# Patient Record
Sex: Female | Born: 2000 | Race: Black or African American | Hispanic: No | Marital: Single | Smoking: Never smoker
Health system: Southern US, Community
[De-identification: ages and names within clinical notes are randomized; demographics above are authoritative.]

## PROBLEM LIST (undated history)

## (undated) ENCOUNTER — Inpatient Hospital Stay (HOSPITAL_COMMUNITY): Payer: Self-pay

## (undated) DIAGNOSIS — L309 Dermatitis, unspecified: Secondary | ICD-10-CM

## (undated) DIAGNOSIS — D649 Anemia, unspecified: Secondary | ICD-10-CM

## (undated) DIAGNOSIS — A64 Unspecified sexually transmitted disease: Secondary | ICD-10-CM

## (undated) DIAGNOSIS — D219 Benign neoplasm of connective and other soft tissue, unspecified: Secondary | ICD-10-CM

## (undated) DIAGNOSIS — T783XXA Angioneurotic edema, initial encounter: Secondary | ICD-10-CM

## (undated) DIAGNOSIS — L509 Urticaria, unspecified: Secondary | ICD-10-CM

## (undated) HISTORY — DX: Anemia, unspecified: D64.9

## (undated) HISTORY — DX: Dermatitis, unspecified: L30.9

## (undated) HISTORY — PX: EYE SURGERY: SHX253

## (undated) HISTORY — DX: Angioneurotic edema, initial encounter: T78.3XXA

## (undated) HISTORY — DX: Urticaria, unspecified: L50.9

## (undated) HISTORY — DX: Unspecified sexually transmitted disease: A64

---
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
## 2018-10-20 ENCOUNTER — Ambulatory Visit (INDEPENDENT_AMBULATORY_CARE_PROVIDER_SITE_OTHER): Payer: 59 | Admitting: Certified Nurse Midwife

## 2018-10-20 ENCOUNTER — Other Ambulatory Visit: Payer: Self-pay

## 2018-10-20 ENCOUNTER — Encounter: Payer: Self-pay | Admitting: Certified Nurse Midwife

## 2018-10-20 VITALS — BP 110/64 | HR 68 | Temp 97.2°F | Resp 16 | Ht 62.75 in | Wt 142.0 lb

## 2018-10-20 DIAGNOSIS — Z8619 Personal history of other infectious and parasitic diseases: Secondary | ICD-10-CM | POA: Diagnosis not present

## 2018-10-20 DIAGNOSIS — Z113 Encounter for screening for infections with a predominantly sexual mode of transmission: Secondary | ICD-10-CM | POA: Diagnosis not present

## 2018-10-20 DIAGNOSIS — Z01419 Encounter for gynecological examination (general) (routine) without abnormal findings: Secondary | ICD-10-CM | POA: Diagnosis not present

## 2018-10-20 NOTE — Patient Instructions (Signed)
General topics  Next pap or exam is  due in 1 year Take a Women's multivitamin Take 1200 mg. of calcium daily - prefer dietary If any concerns in interim to call back  Breast Self-Awareness Practicing breast self-awareness may pick up problems early, prevent significant medical complications, and possibly save your life. By practicing breast self-awareness, you can become familiar with how your breasts look and feel and if your breasts are changing. This allows you to notice changes early. It can also offer you some reassurance that your breast health is good. One way to learn what is normal for your breasts and whether your breasts are changing is to do a breast self-exam. If you find a lump or something that was not present in the past, it is best to contact your caregiver right away. Other findings that should be evaluated by your caregiver include nipple discharge, especially if it is bloody; skin changes or reddening; areas where the skin seems to be pulled in (retracted); or new lumps and bumps. Breast pain is seldom associated with cancer (malignancy), but should also be evaluated by a caregiver. BREAST SELF-EXAM The best time to examine your breasts is 5 7 days after your menstrual period is over.  ExitCare Patient Information 2013 [place].   Exercise to Stay Healthy Exercise helps you become and stay healthy. EXERCISE IDEAS AND TIPS Choose exercises that:  You enjoy.  Fit into your day. You do not need to exercise really hard to be healthy. You can do exercises at a slow or medium level and stay healthy. You can:  Stretch before and after working out.  Try yoga, Pilates, or tai chi.  Lift weights.  Walk fast, swim, jog, run, climb stairs, bicycle, dance, or rollerskate.  Take aerobic classes. Exercises that burn about 150 calories:  Running 1  miles in 15 minutes.  Playing volleyball for 45 to 60 minutes.  Washing and waxing a car for 45 to 60 minutes.   Playing touch football for 45 minutes.  Walking 1  miles in 35 minutes.  Pushing a stroller 1  miles in 30 minutes.  Playing basketball for 30 minutes.  Raking leaves for 30 minutes.  Bicycling 5 miles in 30 minutes.  Walking 2 miles in 30 minutes.  Dancing for 30 minutes.  Shoveling snow for 15 minutes.  Swimming laps for 20 minutes.  Walking up stairs for 15 minutes.  Bicycling 4 miles in 15 minutes.  Gardening for 30 to 45 minutes.  Jumping rope for 15 minutes.  Washing windows or floors for 45 to 60 minutes. Document Released: 03/01/2010 Document Revised: 04/21/2011 Document Reviewed: 03/01/2010 [facility] Patient Information 2013 [place].   Other topics ( that may be useful information):    Sexually Transmitted Disease Sexually transmitted disease (STD) refers to any infection that is passed from person to person during sexual activity. This may happen by way of saliva, semen, blood, vaginal mucus, or urine. Common STDs include:  Gonorrhea.  Chlamydia.  Syphilis.  HIV/AIDS.  Genital herpes.  Hepatitis B and C.  Trichomonas.  Human papillomavirus (HPV).  Pubic lice. CAUSES  An STD may be spread by bacteria, virus, or parasite. A person can get an STD by:  Sexual intercourse with an infected person.  Sharing sex toys with an infected person.  Sharing needles with an infected person.  Having intimate contact with the genitals, mouth, or rectal areas of an infected person. SYMPTOMS  Some people may not have any symptoms, but  they can still pass the infection to others. Different STDs have different symptoms. Symptoms include:  Painful or bloody urination.  Pain in the pelvis, abdomen, vagina, anus, throat, or eyes.  Skin rash, itching, irritation, growths, or sores (lesions). These usually occur in the genital or anal area.  Abnormal vaginal discharge.  Penile discharge in men.  Soft, flesh-colored skin growths in the genital  or anal area.  Fever.  Pain or bleeding during sexual intercourse.  Swollen glands in the groin area.  Yellow skin and eyes (jaundice). This is seen with hepatitis. DIAGNOSIS  To make a diagnosis, your caregiver may:  Take a medical history.  Perform a physical exam.  Take a specimen (culture) to be examined.  Examine a sample of discharge under a microscope.  Perform blood test TREATMENT   Chlamydia, gonorrhea, trichomonas, and syphilis can be cured with antibiotic medicine.  Genital herpes, hepatitis, and HIV can be treated, but not cured, with prescribed medicines. The medicines will lessen the symptoms.  Genital warts from HPV can be treated with medicine or by freezing, burning (electrocautery), or surgery. Warts may come back.  HPV is a virus and cannot be cured with medicine or surgery.However, abnormal areas may be followed very closely by your caregiver and may be removed from the cervix, vagina, or vulva through office procedures or surgery. If your diagnosis is confirmed, your recent sexual partners need treatment. This is true even if they are symptom-free or have a negative culture or evaluation. They should not have sex until their caregiver says it is okay. HOME CARE INSTRUCTIONS  All sexual partners should be informed, tested, and treated for all STDs.  Take your antibiotics as directed. Finish them even if you start to feel better.  Only take over-the-counter or prescription medicines for pain, discomfort, or fever as directed by your caregiver.  Rest.  Eat a balanced diet and drink enough fluids to keep your urine clear or pale yellow.  Do not have sex until treatment is completed and you have followed up with your caregiver. STDs should be checked after treatment.  Keep all follow-up appointments, Pap tests, and blood tests as directed by your caregiver.  Only use latex condoms and water-soluble lubricants during sexual activity. Do not use petroleum  jelly or oils.  Avoid alcohol and illegal drugs.  Get vaccinated for HPV and hepatitis. If you have not received these vaccines in the past, talk to your caregiver about whether one or both might be right for you.  Avoid risky sex practices that can break the skin. The only way to avoid getting an STD is to avoid all sexual activity.Latex condoms and dental dams (for oral sex) will help lessen the risk of getting an STD, but will not completely eliminate the risk. SEEK MEDICAL CARE IF:   You have a fever.  You have any new or worsening symptoms. Document Released: 04/19/2002 Document Revised: 04/21/2011 Document Reviewed: 04/26/2010 [facility] Patient Information 2013 [place].    Domestic Abuse You are being battered or abused if someone close to you hits, pushes, or physically hurts you in any way. You also are being abused if you are forced into activities. You are being sexually abused if you are forced to have sexual contact of any kind. You are being emotionally abused if you are made to feel worthless or if you are constantly threatened. It is important to remember that help is available. No one has the right to abuse you. PREVENTION OF FURTHER  ABUSE  Learn the warning signs of danger. This varies with situations but may include: the use of alcohol, threats, isolation from friends and family, or forced sexual contact. Leave if you feel that violence is going to occur.  If you are attacked or beaten, report it to the police so the abuse is documented. You do not have to press charges. The police can protect you while you or the attackers are leaving. Get the officer's name and badge number and a copy of the report.  Find someone you can trust and tell them what is happening to you: your caregiver, a nurse, clergy member, close friend or family member. Feeling ashamed is natural, but remember that you have done nothing wrong. No one deserves abuse. Document Released: 01/25/2000  Document Revised: 04/21/2011 Document Reviewed: 04/04/2010 [facility] Patient Information 2013 [place].    How Much is Too Much Alcohol? Drinking too much alcohol can cause injury, accidents, and health problems. These types of problems can include:   Car crashes.  Falls.  Family fighting (domestic violence).  Drowning.  Fights.  Injuries.  Burns.  Damage to certain organs.  Having a baby with birth defects. ONE DRINK CAN BE TOO MUCH WHEN YOU ARE:  Working.  Pregnant or breastfeeding.  Taking medicines. Ask your doctor.  Driving or planning to drive. If you or someone you know has a drinking problem, get help from a doctor.  Document Released: 11/23/2008 Document Revised: 04/21/2011 Document Reviewed: 11/23/2008 [facility] Patient Information 2013 [place].   Smoking Hazards Smoking cigarettes is extremely bad for your health. Tobacco smoke has over 200 known poisons in it. There are over 60 chemicals in tobacco smoke that cause cancer. Some of the chemicals found in cigarette smoke include:   Cyanide.  Benzene.  Formaldehyde.  Methanol (wood alcohol).  Acetylene (fuel used in welding torches).  Ammonia. Cigarette smoke also contains the poisonous gases nitrogen oxide and carbon monoxide.  Cigarette smokers have an increased risk of many serious medical problems and Smoking causes approximately:  90% of all lung cancer deaths in men.  80% of all lung cancer deaths in women.  90% of deaths from chronic obstructive lung disease. Compared with nonsmokers, smoking increases the risk of:  Coronary heart disease by 2 to 4 times.  Stroke by 2 to 4 times.  Men developing lung cancer by 23 times.  Women developing lung cancer by 13 times.  Dying from chronic obstructive lung diseases by 12 times.  . Smoking is the most preventable cause of death and disease in our society.  WHY IS SMOKING ADDICTIVE?  Nicotine is the chemical agent in  tobacco that is capable of causing addiction or dependence.  When you smoke and inhale, nicotine is absorbed rapidly into the bloodstream through your lungs. Nicotine absorbed through the lungs is capable of creating a powerful addiction. Both inhaled and non-inhaled nicotine may be addictive.  Addiction studies of cigarettes and spit tobacco show that addiction to nicotine occurs mainly during the teen years, when young people begin using tobacco products. WHAT ARE THE BENEFITS OF QUITTING?  There are many health benefits to quitting smoking.   Likelihood of developing cancer and heart disease decreases. Health improvements are seen almost immediately.  Blood pressure, pulse rate, and breathing patterns start returning to normal soon after quitting. QUITTING SMOKING   American Lung Association - 1-800-LUNGUSA  American Cancer Society - 1-800-ACS-2345 Document Released: 03/06/2004 Document Revised: 04/21/2011 Document Reviewed: 11/08/2008 [facility] Patient Information 2013 [place],  [place].   Stress Management Stress is a state of physical or mental tension that often results from changes in your life or normal routine. Some common causes of stress are:  Death of a loved one.  Injuries or severe illnesses.  Getting fired or changing jobs.  Moving into a new home. Other causes may be:  Sexual problems.  Business or financial losses.  Taking on a large debt.  Regular conflict with someone at home or at work.  Constant tiredness from lack of sleep. It is not just bad things that are stressful. It may be stressful to:  Win the lottery.  Get married.  Buy a new car. The amount of stress that can be easily tolerated varies from person to person. Changes generally cause stress, regardless of the types of change. Too much stress can affect your health. It may lead to physical or emotional problems. Too little stress (boredom) may also become stressful. SUGGESTIONS TO REDUCE  STRESS:  Talk things over with your family and friends. It often is helpful to share your concerns and worries. If you feel your problem is serious, you may want to get help from a professional counselor.  Consider your problems one at a time instead of lumping them all together. Trying to take care of everything at once may seem impossible. List all the things you need to do and then start with the most important one. Set a goal to accomplish 2 or 3 things each day. If you expect to do too many in a single day you will naturally fail, causing you to feel even more stressed.  Do not use alcohol or drugs to relieve stress. Although you may feel better for a short time, they do not remove the problems that caused the stress. They can also be habit forming.  Exercise regularly - at least 3 times per week. Physical exercise can help to relieve that "uptight" feeling and will relax you.  The shortest distance between despair and hope is often a good night's sleep.  Go to bed and get up on time allowing yourself time for appointments without being rushed.  Take a short "time-out" period from any stressful situation that occurs during the day. Close your eyes and take some deep breaths. Starting with the muscles in your face, tense them, hold it for a few seconds, then relax. Repeat this with the muscles in your neck, shoulders, hand, stomach, back and legs.  Take good care of yourself. Eat a balanced diet and get plenty of rest.  Schedule time for having fun. Take a break from your daily routine to relax. HOME CARE INSTRUCTIONS   Call if you feel overwhelmed by your problems and feel you can no longer manage them on your own.  Return immediately if you feel like hurting yourself or someone else. Document Released: 07/23/2000 Document Revised: 04/21/2011 Document Reviewed: 03/15/2007 Exit[employer] Patient Information 2013 [employer].   Preventing Sexually Transmitted Infections, Adult Sexually  transmitted infections (STIs) are diseases that are passed (transmitted) from person to person through bodily fluids exchanged during sex or sexual contact. Bodily fluids include saliva, semen, blood, vaginal mucus, and urine. You may have an increased risk for developing an STI if you have unprotected oral, vaginal, or anal sex. Some common STIs include:  Herpes.  Hepatitis B.  Chlamydia.  Gonorrhea.  Syphilis.  HPV (human papillomavirus).  HIV (human immunodeficiency virus), the virus that can cause AIDS (acquired immunodeficiency syndrome). How can I protect myself from   sexually transmitted infections? The only way to completely prevent STIs is not to have sex of any kind (practice abstinence). This includes oral, vaginal, or anal sex. If you are sexually active, take these actions to lower your risk of getting an STI:  Have only one sex partner (be monogamous) or limit the number of sexual partners you have.  Stay up-to-date on immunizations. Certain vaccines can lower your risk of getting certain STIs, such as: ? Hepatitis A and B vaccines. You may have been vaccinated as a young child, but likely need a booster shot as a teen or young adult. ? HPV vaccine.  Use methods that prevent the exchange of body fluids between partners (barrier protection) every time you have sex. Barrier protection can be used during oral, vaginal, or anal sex. Commonly used barrier methods include: ? Female condom. ? Female condom. ? Dental dam.  Get tested regularly for STIs. Have your sexual partner get tested regularly as well.  Avoid mixing alcohol, drugs, and sex. Alcohol and drug use can affect your ability to make good decisions and can lead to risky sexual behaviors.  Ask your health care provider about taking pre-exposure prophylaxis (PrEP) to prevent HIV infection if you: ? Have a HIV-positive sexual partner. ? Have multiple sexual partners or partners who do not know their HIV status, and  do not regularly use a condom during sex. ? Use injection drugs and share needles. Birth control pills, injections, implants, and intrauterine devices (IUDs) do not protect against STIs. To prevent both STIs and pregnancy, always use a condom with another form of birth control. Some STIs, such as herpes, are spread through skin to skin contact. A condom does not protect you from getting such STIs. If you or your partner have herpes and there is an active flare with open sores, avoid all sexual contact. Why are these changes important? Taking steps to practice safe sex protects you and others. Many STIs can be cured. However, some STIs are not curable and will affect you for the rest of your life. STIs can be passed on to another person even if you do not have symptoms. What can happen if changes are not made? Certain STIs may:  Require you to take medicine for the rest of your life.  Affect your ability to have children (your fertility).  Increase your risk for developing another STI or certain serious health conditions, such as: ? Cervical cancer. ? Head and neck cancer. ? Pelvic inflammatory disease (PID) in women. ? Organ damage or damage to other parts of your body, if the infection spreads.  Be passed to a baby during childbirth. How are sexually transmitted infections treated? If you or your partner know or think that you may have an STI:  Talk with your health care provider about what can be done to treat it. Some STIs can be treated and cured with medicines.  For curable STIs, you and your partner should avoid sex during treatment and for several days after treatment is complete.  You and your partner should both be treated at the same time, if there is any chance that your partner is infected as well. If you get treatment but your partner does not, your partner can re-infect you when you resume sexual contact.  Do not have unprotected sex. Where to find more information Learn  more about sexually transmitted diseases and infections from:  Centers for Disease Control and Prevention: ? More information about specific STIs: www.cdc.gov/std ? Find   places to get sexual health counseling and treatment for free or for a low cost: gettested.StoreMirror.com.cy  U.S. Department of Health and Human Services: http://white.info/.html Summary  The only way to completely prevent STIs is not to have sex (practice abstinence), including oral, vaginal, or anal sex.  STIs can spread through saliva, semen, blood, vaginal mucus, urine, or sexual contact.  If you do have sex, limit your number of sexual partners and use a barrier protection method every time you have sex.  If you develop an STI, get treated right away and ask your partner to be treated as well. Do not resume having sex until both of you have completed treatment for the STI. This information is not intended to replace advice given to you by your health care provider. Make sure you discuss any questions you have with your health care provider. Document Released: 01/24/2016 Document Revised: 07/03/2017 Document Reviewed: 01/24/2016 Elsevier Patient Education  2020 [employer].

## 2018-10-20 NOTE — Progress Notes (Signed)
18 y.o. G0P0000 Single  African American Fe here to establish gyn care, for std testing only. She has never had pelvic exam.. Patient was treated for Chlamydia approximately 2 months ago. She does not feel partner went for treatment. Having some white discharge, no odor or itching, just feels different. No pelvic pain. Periods normal monthly,no missed periods. Contraception occasional condom. Would like to have vaginal screening and blood work. No other health issues today.  Patient's last menstrual period was 10/04/2018 (exact date).          Sexually active: Yes.    The current method of family planning is none.    Exercising:  Smoker:  no  Review of Systems  Constitutional: Negative.   HENT: Negative.   Eyes: Negative.   Respiratory: Negative.   Cardiovascular: Negative.   Gastrointestinal: Negative.   Genitourinary:       Vaginal discharge, no odor  Musculoskeletal: Negative.   Skin: Negative.   Neurological: Negative.   Endo/Heme/Allergies: Negative.   Psychiatric/Behavioral: Negative.     Health Maintenance: Pap:  none History of Abnormal Pap: no MMG:  none Hep C and HIV: never done Labs: yes today   reports that she has never smoked. She has never used smokeless tobacco. She reports that she does not drink alcohol or use drugs.  Past Medical History:  Diagnosis Date  . Anemia   . STD (sexually transmitted disease)    chlamydia treated    Past Surgical History:  Procedure Laterality Date  . EYE SURGERY    . EYE SURGERY  2006    No current outpatient medications on file.   No current facility-administered medications for this visit.     Family History  Problem Relation Age of Onset  . Hypertension Mother   . Thyroid disease Mother   . Diabetes Father   . Cancer Paternal Aunt     ROS:  Pertinent items are noted in HPI.  Otherwise, a comprehensive ROS was negative.  Exam:   BP 110/64   Pulse 68   Temp (!) 97.2 F (36.2 C) (Skin)   Resp 16   Ht 5'  2.75" (1.594 m)   Wt 142 lb (64.4 kg)   LMP 10/04/2018 (Exact Date)   BMI 25.36 kg/m  Height: 5' 2.75" (159.4 cm) Ht Readings from Last 3 Encounters:  10/20/18 5' 2.75" (1.594 m) (27 %, Z= -0.61)*  09/12/16 5' 3.5" (1.613 m) (39 %, Z= -0.27)*  01/09/14 5\' 3"  (1.6 m) (39 %, Z= -0.29)*   * Growth percentiles are based on CDC (Girls, 2-20 Years) data.    General appearance: alert, cooperative and appears stated age Head: Normocephalic, without obvious abnormality, atraumatic Heart: regular rate and rhythm Abdomen: soft, non-tender; no masses,  no organomegaly Skin: Skin color, texture, turgor normal. No rashes or lesions No abnormal inguinal nodes palpated Neurologic: Grossly normal   Pelvic: External genitalia:  no lesions, normal female              Urethra:  normal appearing urethra with no masses, tenderness or lesions              Bartholin's and Skene's: normal                 Vagina: normal appearing vagina with normal color and white non odorous discharge, no lesions              Cervix: no cervical motion tenderness, no lesions and nulliparous appearance  Pap taken: No. Bimanual Exam:  Uterus:  normal size, contour, position, consistency, mobility, non-tender and anteverted              Adnexa: normal adnexa and no mass, fullness, tenderness               Rectovaginal: Confirms               Anus:  normal appearance no lesions  Chaperone present: yes  A:  Normal pelvic exam  Contraception  Occasional condom use  STD screening desired  History of Chlamydia treated  P:   Reviewed normal pelvic exam and vaginal discharge noted. Discussed comfort measures for discharge.   Discussed no evidence in appearance of STD, but will await labs. Discussed STD prevention with condoms consistent use.  Lab: Affirm, Gc/Chlamydia, HIV,RPR, Hep C  Pap smear: no  Recommended aex and come in for contraception discussion. Patient will consider. Questions addressed regarding  STD's.  An After Visit Summary was printed and given to the patient.

## 2018-10-21 LAB — RPR: RPR Ser Ql: NONREACTIVE

## 2018-10-21 LAB — GC/CHLAMYDIA PROBE AMP
Chlamydia trachomatis, NAA: NEGATIVE
Neisseria Gonorrhoeae by PCR: NEGATIVE

## 2018-10-21 LAB — HIV ANTIBODY (ROUTINE TESTING W REFLEX): HIV Screen 4th Generation wRfx: NONREACTIVE

## 2018-10-21 LAB — VAGINITIS/VAGINOSIS, DNA PROBE
Candida Species: NEGATIVE
Gardnerella vaginalis: POSITIVE — AB
Trichomonas vaginosis: NEGATIVE

## 2018-10-21 LAB — HEPATITIS C ANTIBODY: Hep C Virus Ab: <0.1 s/co ratio (ref 0.0–0.9)

## 2018-10-22 ENCOUNTER — Other Ambulatory Visit: Payer: Self-pay

## 2018-10-22 MED ORDER — METRONIDAZOLE 500 MG PO TABS: 500.0000 mg | ORAL_TABLET | Freq: Two times a day (BID) | ORAL | 0 refills | Status: DC

## 2018-12-05 IMAGING — DX DG FOOT COMPLETE 3+V*R*
3 series · 3 of 3 positions shown · non-contrast
Comparison: None.

CLINICAL DATA: Right foot injury.  Initial encounter

EXAM:
RIGHT FOOT COMPLETE - 3+ VIEW

[foot ap]
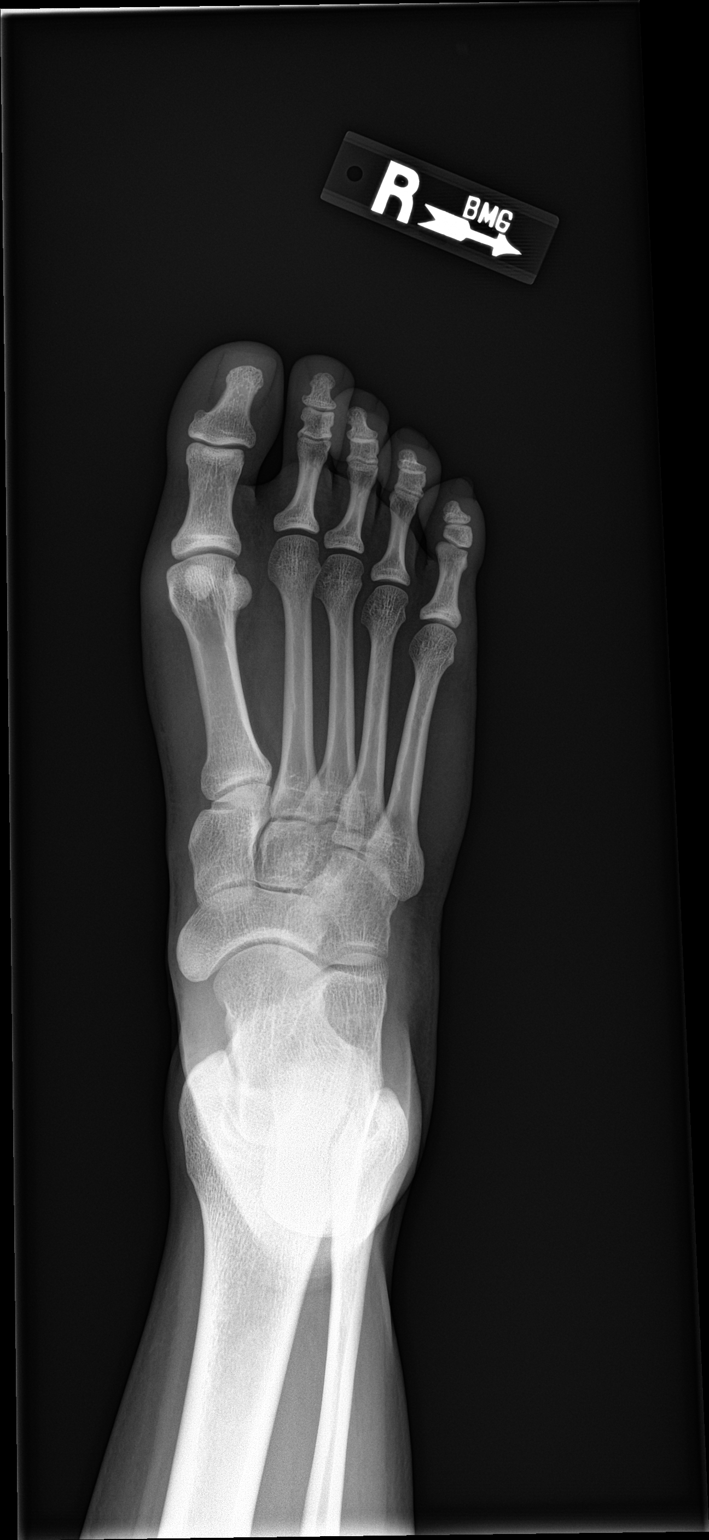

[foot obl]
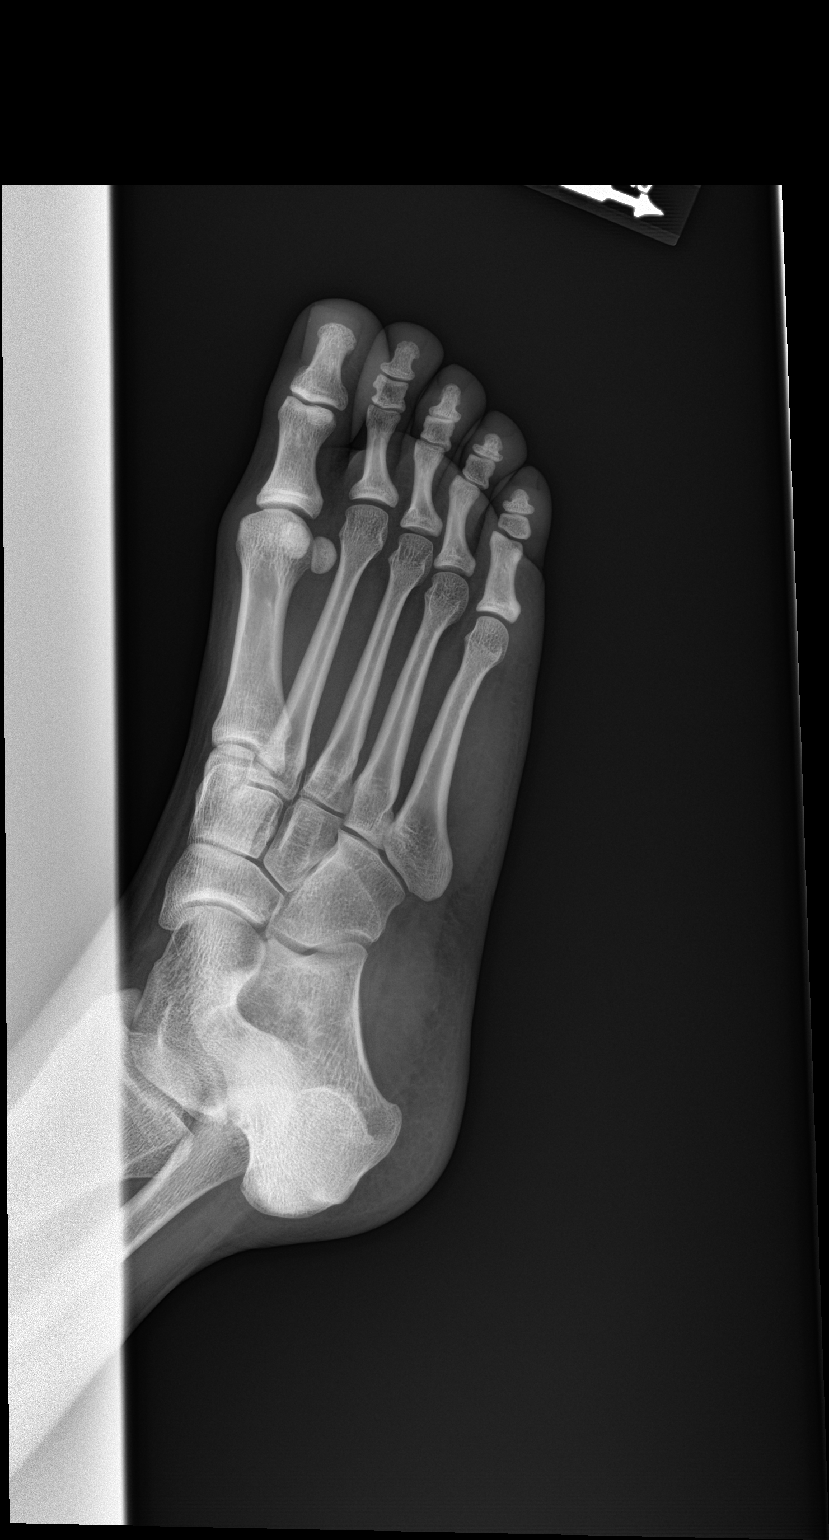

[foot lat]
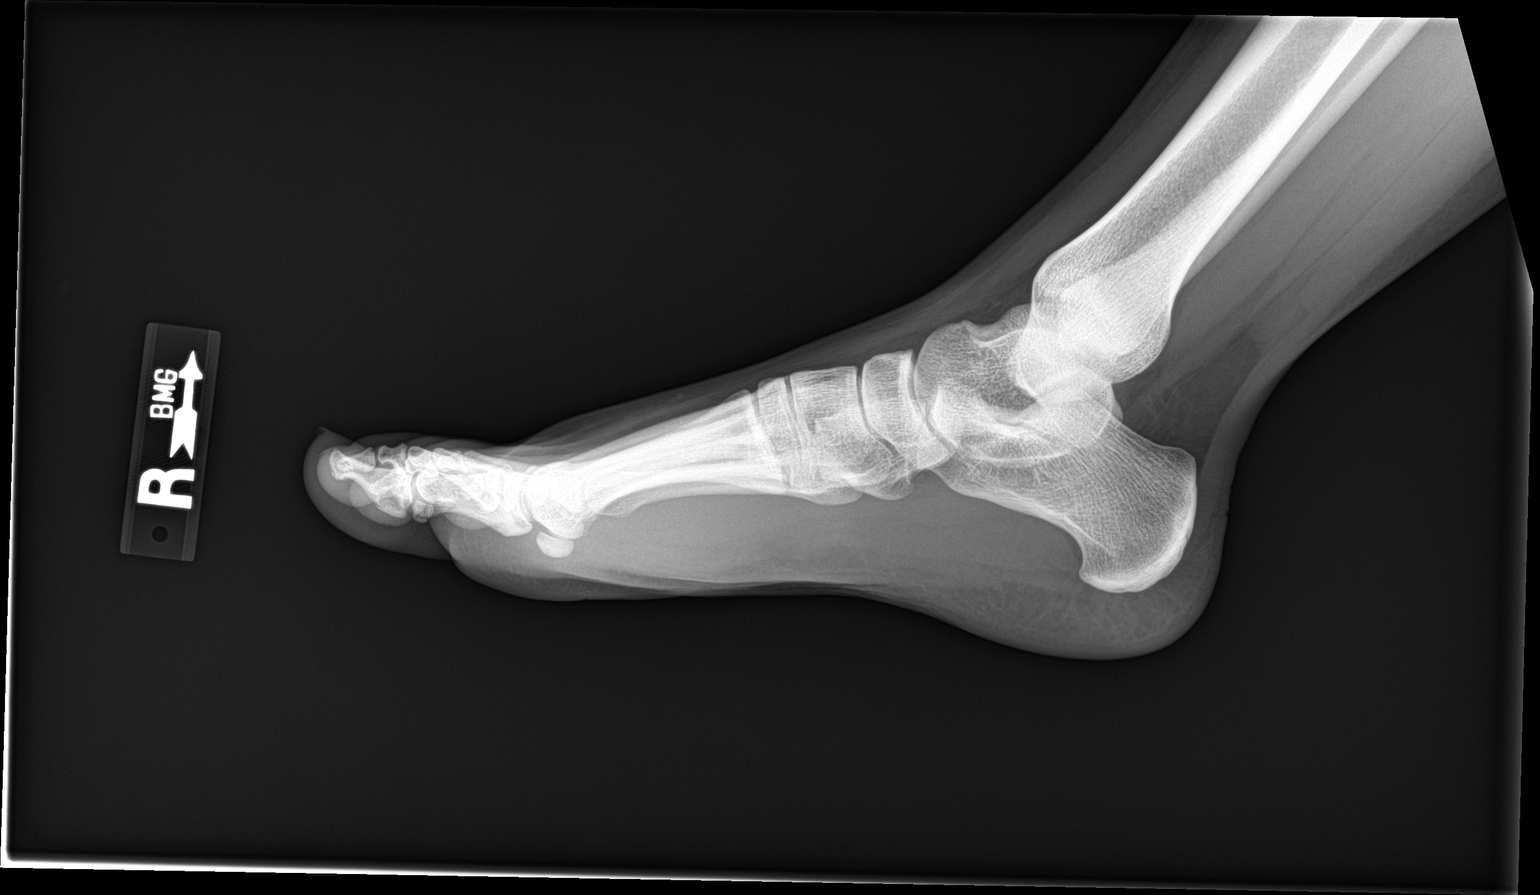

[3 of 3 positions shown; findings below may reference images not displayed]

FINDINGS: There is no evidence of fracture or dislocation. There is no
evidence of arthropathy or other focal bone abnormality. Soft
tissues are unremarkable.
IMPRESSION: Negative.

## 2019-04-25 ENCOUNTER — Encounter: Payer: Self-pay | Admitting: Certified Nurse Midwife

## 2019-04-27 ENCOUNTER — Encounter: Payer: Self-pay | Admitting: Certified Nurse Midwife

## 2019-05-02 ENCOUNTER — Ambulatory Visit
Admission: EM | Admit: 2019-05-02 | Discharge: 2019-05-02 | Disposition: A | Discharge status: Home / Self Care | Payer: 59 | Attending: Emergency Medicine | Admitting: Emergency Medicine

## 2019-05-02 ENCOUNTER — Encounter: Payer: Self-pay | Admitting: Emergency Medicine

## 2019-05-02 ENCOUNTER — Other Ambulatory Visit: Payer: Self-pay

## 2019-05-02 ENCOUNTER — Ambulatory Visit (INDEPENDENT_AMBULATORY_CARE_PROVIDER_SITE_OTHER): Payer: 59

## 2019-05-02 DIAGNOSIS — M79671 Pain in right foot: Secondary | ICD-10-CM | POA: Diagnosis not present

## 2019-05-02 NOTE — ED Provider Notes (Signed)
[facility]    CSN: [id number] Arrival date & time: 05/02/19  1444      History   Chief Complaint Chief Complaint  Patient presents with  . Foot Pain    HPI Crystal Rhodes is a 19 y.o. female scented for right foot pain after dropping air drill on her foot when try to change her tire last night.  States that caused a break in her skin through her shoe.  Was able to irrigate wound at home: Feels it is healing well.  Last tetanus 2 years ago.  Patient largely seeking evaluation to make sure it is not broken.  Endorsing pain, swelling, pain with weightbearing.    Past Medical History:  Diagnosis Date  . Anemia   . STD (sexually transmitted disease)    chlamydia treated    Patient Active Problem List   Diagnosis Date Noted  . Iron deficiency anemia secondary to inadequate dietary iron intake 11/08/2016  . Menorrhagia with regular cycle 10/26/2015    Past Surgical History:  Procedure Laterality Date  . EYE SURGERY    . EYE SURGERY  2006    OB History    Gravida  0   Para  0   Term  0   Preterm  0   AB  0   Living  0     SAB  0   TAB  0   Ectopic  0   Multiple  0   Live Births  0            Home Medications    Prior to Admission medications   Not on File    Family History Family History  Problem Relation Age of Onset  . Hypertension Mother   . Thyroid disease Mother   . Diabetes Father   . Cancer Paternal Aunt     Social History Social History   Tobacco Use  . Smoking status: Never Smoker  . Smokeless tobacco: Never Used  Substance Use Topics  . Alcohol use: No    Alcohol/week: 0.0 standard drinks  . Drug use: No     Allergies   Patient has no known allergies.   Review of Systems As per HPI  Physical Exam Triage Vital Signs ED Triage Vitals  Enc Vitals Group     BP 05/02/19 1459 131/85     Pulse Rate 05/02/19 1459 95     Resp 05/02/19 1459 16     Temp 05/02/19 1459 98 F (36.7 C)     Temp Source  05/02/19 1459 Temporal     SpO2 05/02/19 1459 98 %     Weight --      Height --      Head Circumference --      Peak Flow --      Pain Score 05/02/19 1451 6     Pain Loc --      Pain Edu? --      Excl. in GC? --    No data found.  Updated Vital Signs BP 131/85 (BP Location: Left Arm)   Pulse 95   Temp 98 F (36.7 C) (Temporal)   Resp 16   LMP 05/01/2019   SpO2 98%   Visual Acuity Right Eye Distance:   Left Eye Distance:   Bilateral Distance:    Right Eye Near:   Left Eye Near:    Bilateral Near:     Physical Exam Constitutional:      General: She is not  in acute distress. HENT:     Head: Normocephalic and atraumatic.  Eyes:     General: No scleral icterus.    Pupils: Pupils are equal, round, and reactive to light.  Cardiovascular:     Rate and Rhythm: Normal rate.  Pulmonary:     Effort: Pulmonary effort is normal.  Musculoskeletal:        General: Swelling and tenderness present. No deformity. Normal range of motion.  Skin:    Coloration: Skin is not jaundiced or pale.     Comments: Small superficial abrasion noted to dorsal aspect of midfoot.  No active bleeding, discharge, foreign body  Neurological:     Mental Status: She is alert and oriented to person, place, and time.      UC Treatments / Results  Labs (all labs ordered are listed, but only abnormal results are displayed) Labs Reviewed - No data to display  EKG   Radiology DG Foot Complete Right  Result Date: 05/02/2019 CLINICAL DATA:  Object dropped on foot, pain across the metatarsals with laceration. EXAM: RIGHT FOOT COMPLETE - 3+ VIEW COMPARISON:  Multiple exams, including 10/27/2016 FINDINGS: There is no evidence of fracture or dislocation. There is no evidence of arthropathy or other focal bone abnormality. No foreign body or specific soft tissue abnormality identified. IMPRESSION: Negative. Electronically Signed   By: Van Clines M.D.   On: 05/02/2019 15:16     Procedures Procedures (including critical care time)  Medications Ordered in UC Medications - No data to display  Initial Impression / Assessment and Plan / UC Course  I have reviewed the triage vital signs and the nursing notes.  Pertinent labs & imaging results that were available during my care of the patient were reviewed by me and considered in my medical decision making (see chart for details).     Right foot x-ray done in office, reviewed by me radiology: Negative for fracture chemotherapy, soft tissue swelling.  Reviewed findings with patient who verbalized understanding.  Ace wrap applied in office with patient tolerated well.  Return precautions discussed, patient verbalized understanding and is agreeable to plan. Final Clinical Impressions(s) / UC Diagnoses   Final diagnoses:  Right foot pain     Discharge Instructions     Recommend RICE: rest, ice, compression, elevation as needed for pain.   Cold therapy (ice packs) can be used to help swelling both after injury and after prolonged use of areas of chronic pain/aches.  For pain: recommend 350 mg-1000 mg of Tylenol (acetaminophen) and/or 200 mg - 800 mg of Advil (ibuprofen, Motrin) every 8 hours as needed.  May alternate between the two throughout the day as they are generally safe to take together.  DO NOT exceed more than 3000 mg of Tylenol or 3200 mg of ibuprofen in a 24 hour period as this could damage your stomach, kidneys, liver, or increase your bleeding risk.    ED Prescriptions    None     PDMP not reviewed this encounter.   Hall[place]Potvin, [place], [place] 05/02/19 1525

## 2019-05-02 NOTE — ED Triage Notes (Addendum)
Pt presents to [facility] for assessment after dropping an air drill onto her right foot.  Patient states it opened her skin through her foot.  Last tetanus approx 2 years ago.  Wants to be sure it's not broken.

## 2019-05-02 NOTE — ED Notes (Signed)
Patient able to ambulate independently  

## 2019-05-02 NOTE — Discharge Instructions (Signed)
Recommend RICE: rest, ice, compression, elevation as needed for pain.   Cold therapy (ice packs) can be used to help swelling both after injury and after prolonged use of areas of chronic pain/aches.  For pain: recommend 350 mg-1000 mg of Tylenol (acetaminophen) and/or 200 mg - 800 mg of Advil (ibuprofen, Motrin) every 8 hours as needed.  May alternate between the two throughout the day as they are generally safe to take together.  DO NOT exceed more than 3000 mg of Tylenol or 3200 mg of ibuprofen in a 24 hour period as this could damage your stomach, kidneys, liver, or increase your bleeding risk. 

## 2019-07-05 ENCOUNTER — Other Ambulatory Visit: Payer: Self-pay

## 2019-07-06 ENCOUNTER — Ambulatory Visit (INDEPENDENT_AMBULATORY_CARE_PROVIDER_SITE_OTHER): Payer: 59 | Admitting: Obstetrics and Gynecology

## 2019-07-06 ENCOUNTER — Encounter: Payer: Self-pay | Admitting: Obstetrics and Gynecology

## 2019-07-06 ENCOUNTER — Other Ambulatory Visit: Payer: Self-pay | Admitting: Obstetrics and Gynecology

## 2019-07-06 VITALS — BP 122/60 | HR 94 | Temp 98.1°F | Ht 62.75 in | Wt 157.6 lb

## 2019-07-06 DIAGNOSIS — Z8619 Personal history of other infectious and parasitic diseases: Secondary | ICD-10-CM | POA: Diagnosis not present

## 2019-07-06 DIAGNOSIS — Z113 Encounter for screening for infections with a predominantly sexual mode of transmission: Secondary | ICD-10-CM

## 2019-07-06 DIAGNOSIS — Z3009 Encounter for other general counseling and advice on contraception: Secondary | ICD-10-CM

## 2019-07-06 DIAGNOSIS — Z708 Other sex counseling: Secondary | ICD-10-CM | POA: Diagnosis not present

## 2019-07-06 NOTE — Progress Notes (Signed)
GYNECOLOGY  VISIT   HPI: 19 y.o.   Single Black or African American Not Hispanic or Latino  female   G0P0000 with Patient's last menstrual period was 07/01/2019.   here for STD testing. Her boy friend told her that he is having discomfort with urination and told her she should be tested.  She states that she has had a sore throat for about two day after oral sex. Currently feeling.  Same partner x 2 months, just became sexually active together. He went to the Doctor and got some shots.  Not interested in getting pregnant. She was with her last partner x 4-5 years, didn't use contraception, never got pregnant.  Menses q month x 5 days, heavy x 2 days. She can saturate a super tampon in 4 hours. Cramps are horrible. She does have PMS.   GYNECOLOGIC HISTORY: Patient's last menstrual period was 07/01/2019. Contraception: none  Menopausal hormone therapy: none        OB History    Gravida  0   Para  0   Term  0   Preterm  0   AB  0   Living  0     SAB  0   TAB  0   Ectopic  0   Multiple  0   Live Births  0              Patient Active Problem List   Diagnosis Date Noted  . Iron deficiency anemia secondary to inadequate dietary iron intake 11/08/2016  . Menorrhagia with regular cycle 10/26/2015    Past Medical History:  Diagnosis Date  . Anemia   . STD (sexually transmitted disease)    chlamydia treated    Past Surgical History:  Procedure Laterality Date  . EYE SURGERY    . EYE SURGERY  2006    No current outpatient medications on file.   No current facility-administered medications for this visit.     ALLERGIES: Patient has no known allergies.  Family History  Problem Relation Age of Onset  . Hypertension Mother   . Thyroid disease Mother   . Diabetes Father   . Cancer Paternal Aunt     Social History   Socioeconomic History  . Marital status: Single    Spouse name: Not on file  . Number of children: Not on file  . Years of education:  Not on file  . Highest education level: Not on file  Occupational History  . Not on file  Tobacco Use  . Smoking status: Never Smoker  . Smokeless tobacco: Never Used  Substance and Sexual Activity  . Alcohol use: No    Alcohol/week: 0.0 standard drinks  . Drug use: No  . Sexual activity: Yes    Partners: Male    Birth control/protection: None  Other Topics Concern  . Not on file  Social History Narrative  . Not on file   Social Determinants of Health   Financial Resource Strain:   . Difficulty of Paying Living Expenses:   Food Insecurity:   . Worried About Charity fundraiser in the Last Year:   . Arboriculturist in the Last Year:   Transportation Needs:   . Film/video editor (Medical):   Marland Kitchen Lack of Transportation (Non-Medical):   Physical Activity:   . Days of Exercise per Week:   . Minutes of Exercise per Session:   Stress:   . Feeling of Stress :   Social Connections:   .  Frequency of Communication with Friends and Family:   . Frequency of Social Gatherings with Friends and Family:   . Attends Religious Services:   . Active Member of Clubs or Organizations:   . Attends Banker Meetings:   Marland Kitchen Marital Status:   Intimate Partner Violence:   . Fear of Current or Ex-Partner:   . Emotionally Abused:   Marland Kitchen Physically Abused:   . Sexually Abused:     Review of Systems  All other systems reviewed and are negative.   PHYSICAL EXAMINATION:    BP 122/60   Pulse 94   Temp 98.1 F (36.7 C)   Ht 5' 2.75" (1.594 m)   Wt 157 lb 9.6 oz (71.5 kg)   LMP 07/01/2019   SpO2 99%   BMI 28.14 kg/m     General appearance: alert, cooperative and appears stated age Oral: no tonsillar erythema  Pelvic: External genitalia:  no lesions              Urethra:  normal appearing urethra with no masses, tenderness or lesions              Bartholins and Skenes: normal                 Vagina: normal appearing vagina with normal color and discharge, no lesions               Cervix: no lesions               Chaperone was present for exam.  ASSESSMENT Screening for STD's, her partner just got tested and ?treated (not aware of +testing, will check) Contraception counseling H/O chlamydia    PLAN Oral, genital and blood work for STD testing Discussed contraception. No contraindications for OCP's, discussed side effects, information given. She will call if she wants to start Discussed condoms for contraception and STD protection (samples given) Discussed risk of tubal damage from some STD's including Chlamydia. She is aware she needs early evaluation in a future pregnancy.  Over 20 minutes in total patient care.

## 2019-07-06 NOTE — Patient Instructions (Signed)

## 2019-07-07 LAB — HEPATITIS C ANTIBODY: Hep C Virus Ab: <0.1 s/co ratio (ref 0.0–0.9)

## 2019-07-07 LAB — HEP, RPR, HIV PANEL
HIV Screen 4th Generation wRfx: NONREACTIVE
Hepatitis B Surface Ag: NEGATIVE
RPR Ser Ql: NONREACTIVE

## 2019-07-08 ENCOUNTER — Telehealth: Payer: Self-pay | Admitting: *Deleted

## 2019-07-08 ENCOUNTER — Ambulatory Visit (INDEPENDENT_AMBULATORY_CARE_PROVIDER_SITE_OTHER): Payer: 59

## 2019-07-08 VITALS — BP 130/67 | HR 76 | Temp 98.2°F | Ht 62.75 in | Wt 157.9 lb

## 2019-07-08 DIAGNOSIS — Z202 Contact with and (suspected) exposure to infections with a predominantly sexual mode of transmission: Secondary | ICD-10-CM | POA: Diagnosis not present

## 2019-07-08 DIAGNOSIS — Z8619 Personal history of other infectious and parasitic diseases: Secondary | ICD-10-CM | POA: Diagnosis not present

## 2019-07-08 LAB — CHLAMYDIA/GONOCOCCUS/TRICHOMONAS, NAA
Chlamydia by NAA: NEGATIVE
Gonococcus by NAA: POSITIVE — AB
Trich vag by NAA: NEGATIVE

## 2019-07-08 LAB — CT/GC NAA, PHARYNGEAL
C TRACH RRNA NPH QL PCR: NEGATIVE
N GONORRHOEA RRNA NPH QL PCR: POSITIVE — AB

## 2019-07-08 MED ORDER — CEFTRIAXONE SODIUM 500 MG IJ SOLR
500.0000 mg | Freq: Once | INTRAMUSCULAR | Status: AC
  Administered 2019-07-08: 500 mg via INTRAMUSCULAR

## 2019-07-08 NOTE — Telephone Encounter (Signed)
Please make sure the patient knows not to have sex for a week, then needs condoms. She needs to confirm that her partner was treated for Gonorrhea.

## 2019-07-08 NOTE — Telephone Encounter (Signed)
Call to patient to advise of positive gonorrhea, cervical and pharyngeal. Treat with Ceftriaxone 500 mg IM x1, today.   Call to patient, advised of results as seen above per Dr. Oscar La.  Patient will come directly to office now for treatment. Patient verbalizes understanding and is agreeable.   Confidential Communicable Disease Report completed and faxed to [facility].   Routing to provider for final review. Patient is agreeable to disposition. Will close encounter.

## 2019-07-08 NOTE — Progress Notes (Signed)
Patient here for Ceftriaxone 500mg  IMx1 for positive Gonorrhea test. Patient observed for 20 min follow injection with no reaction.

## 2019-07-08 NOTE — Telephone Encounter (Signed)
Spoke with patient, advised as seen below per Dr. Oscar La.  OV scheduled for 10/10/19 at 4pm.  Patient verbalizes understanding and is agreeable.  Encounter closed.

## 2019-07-12 ENCOUNTER — Telehealth: Payer: Self-pay | Admitting: Obstetrics and Gynecology

## 2019-07-12 NOTE — Telephone Encounter (Signed)
Patient has a few more questions for Ramos.

## 2019-07-12 NOTE — Telephone Encounter (Signed)
Spoke with patient. Patient was in office on 5/28 for tx of pharyngeal and cervical gonorrhea. Patient reports increased discomfort in pelvic area over the weekend. Vaginal feels more sensitive. Reports vaginal itching and white vaginal d/c. Urinary frequency, voicing normal amounts. Denies vaginal odor, dysuria, flank pain, tenderness, fever/chills. Patient reports she has not been SA since being treated.   Recommended OV for further evaluation. OV scheduled for 6/2 at 4:30pm with Dr. Oscar La.   Advised Dr. Oscar La will review, our office will f/u with any additional recommendations. Patient agreeable.   Routing to provider for final review. Patient is agreeable to disposition. Will close encounter.

## 2019-07-13 ENCOUNTER — Ambulatory Visit: Payer: 59 | Admitting: Obstetrics and Gynecology

## 2019-07-13 ENCOUNTER — Telehealth: Payer: Self-pay

## 2019-07-13 NOTE — Telephone Encounter (Signed)
Patient cancelled appointment for today (07/13/19) at 4:30. Offered patient next available appointment for next week (07/22/19) and patient declined. Patient stated she would be okay and the issue would clear up.

## 2019-07-13 NOTE — Telephone Encounter (Signed)
Patient called to cancel today's appointment (07/13/19). Patient rescheduled for (07/14/19).

## 2019-07-14 NOTE — Telephone Encounter (Signed)
Call to patient, no answer, voicemail not set up.   Patient was seen in office on 07/06/19.  Tx for gonorrhea on 07/08/19.

## 2019-10-10 ENCOUNTER — Encounter: Payer: Self-pay | Admitting: Obstetrics and Gynecology

## 2019-10-10 ENCOUNTER — Ambulatory Visit: Payer: Self-pay | Admitting: Obstetrics and Gynecology

## 2019-10-10 ENCOUNTER — Telehealth: Payer: Self-pay

## 2019-10-10 NOTE — Telephone Encounter (Signed)
Patient did not keep appointment for today.  °

## 2019-10-10 NOTE — Telephone Encounter (Signed)
Call to patient, no answer, voicemail not set up, unable to leave message.   Call to reschedule 3 mo TOC for gonorrhea.  07/06/19 testing positive for gonorrhea.

## 2019-10-11 NOTE — Telephone Encounter (Signed)
MyChart message to patient.  

## 2020-01-31 ENCOUNTER — Other Ambulatory Visit: Payer: Self-pay

## 2020-01-31 ENCOUNTER — Ambulatory Visit (INDEPENDENT_AMBULATORY_CARE_PROVIDER_SITE_OTHER): Payer: 59

## 2020-01-31 DIAGNOSIS — Z23 Encounter for immunization: Secondary | ICD-10-CM

## 2020-01-31 NOTE — Progress Notes (Signed)
   Covid-19 Vaccination Clinic  Name:  Crystal Rhodes    MRN: [id number] DOB: [birth year]  01/31/2020  [name] was observed post Covid-19 immunization for 15 minutes without incident. She was provided with Vaccine Information Sheet and instruction to access the V-Safe system.   [name] was instructed to call 911 with any severe reactions post vaccine: Marland Kitchen Difficulty breathing  . Swelling of face and throat  . A fast heartbeat  . A bad rash all over body  . Dizziness and weakness   Immunizations Administered    Name Date Dose VIS Date Route   Pfizer COVID-19 Vaccine 01/31/2020 11:19 AM 0.3 mL 11/30/2019 Intramuscular   Manufacturer: [employer], [employer]   Lot: [id number]   NDC: [address]-9

## 2020-03-10 ENCOUNTER — Ambulatory Visit: Payer: Self-pay

## 2020-03-11 ENCOUNTER — Ambulatory Visit (INDEPENDENT_AMBULATORY_CARE_PROVIDER_SITE_OTHER): Payer: 59

## 2020-03-11 ENCOUNTER — Other Ambulatory Visit: Payer: Self-pay

## 2020-03-11 DIAGNOSIS — Z23 Encounter for immunization: Secondary | ICD-10-CM

## 2020-03-11 NOTE — Progress Notes (Signed)
   Covid-19 Vaccination Clinic  Name:  Crystal Rhodes    MRN: [id number] DOB: 01/17/2001  03/11/2020  Crystal Rhodes was observed post Covid-19 immunization for 15 minutes without incident. She was provided with Vaccine Information Sheet and instruction to access the V-Safe system.   Crystal Rhodes was instructed to call 911 with any severe reactions post vaccine: Marland Kitchen Difficulty breathing  . Swelling of face and throat  . A fast heartbeat  . A bad rash all over body  . Dizziness and weakness   Immunizations Administered    Name Date Dose VIS Date Route   Pfizer COVID-19 Vaccine 03/11/2020 12:14 PM 0.3 mL 11/30/2019 Intramuscular   Manufacturer: [employer], [employer]   Lot: [id number]   NDC: [address]-4

## 2020-03-13 ENCOUNTER — Ambulatory Visit: Admission: EM | Admit: 2020-03-13 | Discharge: 2020-03-13 | Discharge status: Absent without leave | Payer: 59

## 2020-09-21 ENCOUNTER — Encounter: Payer: Self-pay | Admitting: Obstetrics and Gynecology

## 2020-09-21 ENCOUNTER — Ambulatory Visit (INDEPENDENT_AMBULATORY_CARE_PROVIDER_SITE_OTHER): Payer: Self-pay | Admitting: Obstetrics and Gynecology

## 2020-09-21 ENCOUNTER — Other Ambulatory Visit: Payer: Self-pay

## 2020-09-21 VITALS — BP 132/81 | HR 111 | Ht 62.5 in | Wt 179.0 lb

## 2020-09-21 DIAGNOSIS — Z862 Personal history of diseases of the blood and blood-forming organs and certain disorders involving the immune mechanism: Secondary | ICD-10-CM | POA: Insufficient documentation

## 2020-09-21 DIAGNOSIS — N926 Irregular menstruation, unspecified: Secondary | ICD-10-CM

## 2020-09-21 DIAGNOSIS — Z8619 Personal history of other infectious and parasitic diseases: Secondary | ICD-10-CM

## 2020-09-21 LAB — PREGNANCY, URINE: Preg Test, Ur: NEGATIVE

## 2020-09-21 NOTE — Progress Notes (Signed)
GYNECOLOGY  VISIT   HPI: 20 y.o.   Single Black or African American Not Hispanic or Latino  female   G0P0000 with Patient's last menstrual period was 08/11/2020 (exact date).   here for pregnancy test. Patient states that she is 6 days late to start her period. She says that she has taken 4 pregnancy test. Two were positive and two were negative.   Cycles are typically every 30 days. Currently 6 days late.  LMP was 08/11/20. Normal cycles. H/O chlamydia. She has been with her current partner for a year. They haven't been trying to get pregnant, but not consistently preventing pregnancy. Using w/d sometimes. No bleeding, no pain, some increase in gas. She had a faintly + UPT twice 2 days ago, the lines were faintly + on the first urine of the day. She checked again and one of the tests was possibly + and the other one was negative.   GYNECOLOGIC HISTORY: Patient's last menstrual period was 08/11/2020 (exact date). Contraception:none  Menopausal hormone therapy: none         OB History     Gravida  0   Para  0   Term  0   Preterm  0   AB  0   Living  0      SAB  0   IAB  0   Ectopic  0   Multiple  0   Live Births  0              Patient Active Problem List   Diagnosis Date Noted   Iron deficiency anemia secondary to inadequate dietary iron intake 11/08/2016   Menorrhagia with regular cycle 10/26/2015    Past Medical History:  Diagnosis Date   Anemia    STD (sexually transmitted disease)    chlamydia treated    Past Surgical History:  Procedure Laterality Date   EYE SURGERY     EYE SURGERY  2006    No current outpatient medications on file.   No current facility-administered medications for this visit.     ALLERGIES: Patient has no known allergies.  Family History  Problem Relation Age of Onset   Hypertension Mother    Thyroid disease Mother    Diabetes Father    Cancer Paternal Aunt     Social History   Socioeconomic History   Marital  status: Single    Spouse name: Not on file   Number of children: Not on file   Years of education: Not on file   Highest education level: Not on file  Occupational History   Not on file  Tobacco Use   Smoking status: Never   Smokeless tobacco: Never  Substance and Sexual Activity   Alcohol use: No    Alcohol/week: 0.0 standard drinks   Drug use: No   Sexual activity: Yes    Partners: Male    Birth control/protection: None  Other Topics Concern   Not on file  Social History Narrative   Not on file   Social Determinants of Health   Financial Resource Strain: Not on file  Food Insecurity: Not on file  Transportation Needs: Not on file  Physical Activity: Not on file  Stress: Not on file  Social Connections: Not on file  Intimate Partner Violence: Not on file    Review of Systems  All other systems reviewed and are negative.  PHYSICAL EXAMINATION:    BP 132/81   Pulse (!) 111   Ht 5' 2.5" (  1.588 m)   Wt 179 lb (81.2 kg)   LMP 08/11/2020 (Exact Date)   SpO2 99%   BMI 32.22 kg/m     General appearance: alert, cooperative and appears stated age  74. Missed menses Patient reports faintly + and - UPT's at home 2 days ago - B-HCG Quant - B-HCG Quant; Future - Pregnancy, urine  2. History of chlamydia Reviewed risk of ectopic pregnancy, need for serial HCG's and early ultrasound - B-HCG Quant; Future -Call with pain or bleeding

## 2020-09-22 LAB — HCG, QUANTITATIVE, PREGNANCY: HCG, Total, QN: <3 m[IU]/mL

## 2020-09-23 ENCOUNTER — Encounter: Payer: Self-pay | Admitting: Obstetrics and Gynecology

## 2020-12-03 NOTE — Progress Notes (Signed)
GYNECOLOGY  VISIT   HPI: 20 y.o.   Single Black or African American Not Hispanic or Latino  female   G0P0000 with Patient's last menstrual period was 11/24/2020.   here for a breast check and STD testing. The last two periods have been 17 days late She has a right breast lump that she can feel when she lifts her arm up. She also had spotting after sex a couple of days ago. Sex wasn't painful, the spotting was minimal.   She was seen in 8/22 with a late cycle, BhcG was negative. She is sexually active, using w/d sometimes. She is okay with getting pregnant.  Her last 2 cycles were both 17 days late. No increase in stress, no galactorrhea, no hair loss, fatigue, dry skin, or constipation.   She noticed a lump in her right breast in the last month, it may have gotten smaller, not tender.   GYNECOLOGIC HISTORY: Patient's last menstrual period was 11/24/2020. Contraception:none  Menopausal hormone therapy: none         OB History     Gravida  0   Para  0   Term  0   Preterm  0   AB  0   Living  0      SAB  0   IAB  0   Ectopic  0   Multiple  0   Live Births  0              Patient Active Problem List   Diagnosis Date Noted   History of anemia 09/21/2020   Iron deficiency anemia secondary to inadequate dietary iron intake 11/08/2016   Menorrhagia with regular cycle 10/26/2015    Past Medical History:  Diagnosis Date   Anemia    STD (sexually transmitted disease)    chlamydia treated    Past Surgical History:  Procedure Laterality Date   EYE SURGERY     EYE SURGERY  2006    No current outpatient medications on file.   No current facility-administered medications for this visit.     ALLERGIES: Patient has no known allergies.  Family History  Problem Relation Age of Onset   Hypertension Mother    Thyroid disease Mother    Diabetes Father    Cancer Paternal Aunt     Social History   Socioeconomic History   Marital status: Single    Spouse  name: Not on file   Number of children: Not on file   Years of education: Not on file   Highest education level: Not on file  Occupational History   Not on file  Tobacco Use   Smoking status: Never   Smokeless tobacco: Never  Substance and Sexual Activity   Alcohol use: No    Alcohol/week: 0.0 standard drinks   Drug use: No   Sexual activity: Yes    Partners: Male    Birth control/protection: None  Other Topics Concern   Not on file  Social History Narrative   Not on file   Social Determinants of Health   Financial Resource Strain: Not on file  Food Insecurity: Not on file  Transportation Needs: Not on file  Physical Activity: Not on file  Stress: Not on file  Social Connections: Not on file  Intimate Partner Violence: Not on file    Review of Systems  All other systems reviewed and are negative.  PHYSICAL EXAMINATION:    BP 130/80   Pulse 95   Wt 177 lb (  80.3 kg)   LMP 11/24/2020   SpO2 99%   BMI 31.86 kg/m     General appearance: alert, cooperative and appears stated age Breasts:  pea sized tender lump near the periphery of the right breast at 12 o'clock (only appreciated with her sitting with her right arm raised). No other lumps, no skin changes.  Lymph: no axillary or supraclavicular adenopathy.   Pelvic: External genitalia:  no lesions              Urethra:  normal appearing urethra with no masses, tenderness or lesions              Bartholins and Skenes: normal                 Vagina: normal appearing vagina with normal color and discharge, no lesions              Cervix: no cervical motion tenderness, no lesions, and not friable              Bimanual Exam:  Uterus:  normal size, contour, position, consistency, mobility, non-tender              Adnexa: no mass, fullness, tenderness               Chaperone was present for exam.  1. Secondary oligomenorrhea She will call if she gets to 2 months without a cycle - TSH - Prolactin - Beta hCG quant (ref  lab)  2. Screening examination for STD (sexually transmitted disease) - RPR - HIV Antibody (routine testing w rflx) - Hepatitis C antibody - HSV(herpes simplex vrs) 1+2 ab-IgG - SURESWAB CT/NG/T. vaginalis  3. Breast lump on right side at 12 o'clock position Will set up diagnostic ultrasound  4. Unprotected sexual intercourse Recommended she start a prenatal vitamin.

## 2020-12-04 ENCOUNTER — Ambulatory Visit (INDEPENDENT_AMBULATORY_CARE_PROVIDER_SITE_OTHER): Payer: 59 | Admitting: Obstetrics and Gynecology

## 2020-12-04 ENCOUNTER — Other Ambulatory Visit: Payer: Self-pay

## 2020-12-04 ENCOUNTER — Telehealth: Payer: Self-pay

## 2020-12-04 ENCOUNTER — Encounter: Payer: Self-pay | Admitting: Obstetrics and Gynecology

## 2020-12-04 VITALS — BP 130/80 | HR 95 | Wt 177.0 lb

## 2020-12-04 DIAGNOSIS — N6315 Unspecified lump in the right breast, overlapping quadrants: Secondary | ICD-10-CM

## 2020-12-04 DIAGNOSIS — N914 Secondary oligomenorrhea: Secondary | ICD-10-CM

## 2020-12-04 DIAGNOSIS — Z7251 High risk heterosexual behavior: Secondary | ICD-10-CM

## 2020-12-04 DIAGNOSIS — Z113 Encounter for screening for infections with a predominantly sexual mode of transmission: Secondary | ICD-10-CM | POA: Diagnosis not present

## 2020-12-04 NOTE — Telephone Encounter (Signed)
Crystal Bolk, MD  [facility] Triage   Please set her up for a right breast ultrasound 12 o'clock for a lump.

## 2020-12-04 NOTE — Addendum Note (Signed)
Addended by: Rushie Goltz on: 12/04/2020 12:51 PM   Modules accepted: Orders

## 2020-12-04 NOTE — Telephone Encounter (Signed)
Order placed and breast u/s scheduled for 12/19/20 at 1:30pm. I spoke with patient and informed her. The phone # provided.

## 2020-12-04 NOTE — Addendum Note (Signed)
Addended by: Blima Ledger on: 12/04/2020 12:32 PM   Modules accepted: Orders

## 2020-12-05 ENCOUNTER — Ambulatory Visit: Payer: Self-pay | Admitting: Obstetrics and Gynecology

## 2020-12-05 DIAGNOSIS — Z0289 Encounter for other administrative examinations: Secondary | ICD-10-CM

## 2020-12-05 LAB — SURESWAB CT/NG/T. VAGINALIS
C. trachomatis RNA, TMA: NOT DETECTED
N. gonorrhoeae RNA, TMA: NOT DETECTED
Trichomonas vaginalis RNA: NOT DETECTED

## 2020-12-05 LAB — RPR: RPR Ser Ql: NONREACTIVE

## 2020-12-05 LAB — HCG, QUANTITATIVE, PREGNANCY: HCG, Total, QN: <3 m[IU]/mL

## 2020-12-05 LAB — HEPATITIS C ANTIBODY
Hepatitis C Ab: NONREACTIVE
SIGNAL TO CUT-OFF: 0.08 (ref <1.00)

## 2020-12-05 LAB — HSV(HERPES SIMPLEX VRS) I + II AB-IGG
HAV 1 IGG,TYPE SPECIFIC AB: 36.4 index — ABNORMAL HIGH
HSV 2 IGG,TYPE SPECIFIC AB: 13.3 index — ABNORMAL HIGH

## 2020-12-05 LAB — TSH: TSH: 1.07 mIU/L

## 2020-12-05 LAB — PROLACTIN: Prolactin: 7.7 ng/mL

## 2020-12-05 LAB — HIV ANTIBODY (ROUTINE TESTING W REFLEX): HIV 1&2 Ab, 4th Generation: NONREACTIVE

## 2020-12-10 ENCOUNTER — Ambulatory Visit: Payer: 59 | Admitting: Obstetrics and Gynecology

## 2020-12-10 DIAGNOSIS — Z0289 Encounter for other administrative examinations: Secondary | ICD-10-CM

## 2020-12-10 NOTE — Progress Notes (Deleted)
GYNECOLOGY  VISIT   HPI: 20 y.o.   Single Black or African American Not Hispanic or Latino  female   G0P0000 with Patient's last menstrual period was 11/24/2020.   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    GYNECOLOGIC HISTORY: Patient's last menstrual period was 11/24/2020. Contraception:none  Menopausal hormone therapy: none         OB History     Gravida  0   Para  0   Term  0   Preterm  0   AB  0   Living  0      SAB  0   IAB  0   Ectopic  0   Multiple  0   Live Births  0              Patient Active Problem List   Diagnosis Date Noted   History of anemia 09/21/2020   Iron deficiency anemia secondary to inadequate dietary iron intake 11/08/2016   Menorrhagia with regular cycle 10/26/2015    Past Medical History:  Diagnosis Date   Anemia    STD (sexually transmitted disease)    chlamydia treated    Past Surgical History:  Procedure Laterality Date   EYE SURGERY     EYE SURGERY  2006    No current outpatient medications on file.   No current facility-administered medications for this visit.     ALLERGIES: Patient has no known allergies.  Family History  Problem Relation Age of Onset   Hypertension Mother    Thyroid disease Mother    Diabetes Father    Cancer Paternal Aunt     Social History   Socioeconomic History   Marital status: Single    Spouse name: Not on file   Number of children: Not on file   Years of education: Not on file   Highest education level: Not on file  Occupational History   Not on file  Tobacco Use   Smoking status: Never   Smokeless tobacco: Never  Substance and Sexual Activity   Alcohol use: No    Alcohol/week: 0.0 standard drinks   Drug use: No   Sexual activity: Yes    Partners: Male    Birth control/protection: None  Other Topics Concern   Not on file  Social History Narrative   Not on file   Social Determinants of Health   Financial Resource Strain: Not on file  Food Insecurity: Not  on file  Transportation Needs: Not on file  Physical Activity: Not on file  Stress: Not on file  Social Connections: Not on file  Intimate Partner Violence: Not on file    ROS  PHYSICAL EXAMINATION:    LMP 11/24/2020     General appearance: alert, cooperative and appears stated age Neck: no adenopathy, supple, symmetrical, trachea midline and thyroid {CHL AMB PHY EX THYROID NORM DEFAULT:[id number]::"normal to inspection and palpation"} Breasts: {Exam; breast:13139::"normal appearance, no masses or tenderness"} Abdomen: soft, non-tender; non distended, no masses,  no organomegaly  Pelvic: External genitalia:  no lesions              Urethra:  normal appearing urethra with no masses, tenderness or lesions              Bartholins and Skenes: normal                 Vagina: normal appearing vagina with normal color and discharge, no lesions  Cervix: {CHL AMB PHY EX CERVIX NORM [phone]::"no lesions"}              Bimanual Exam:  Uterus:  {CHL AMB PHY EX UTERUS NORM [phone]::"normal size, contour, position, consistency, mobility, non-tender"}              Adnexa: {CHL AMB PHY EX ADNEXA NO MASS DEFAULT:[phone]::"no mass, fullness, tenderness"}              Rectovaginal: {yes no:[id number]}.  Confirms.              Anus:  normal sphincter tone, no lesions  Chaperone was present for exam.  ASSESSMENT     PLAN    An After Visit Summary was printed and given to the patient.  *** minutes face to face time of which over 50% was spent in counseling.

## 2020-12-19 ENCOUNTER — Other Ambulatory Visit: Payer: 59

## 2020-12-20 ENCOUNTER — Inpatient Hospital Stay: Admission: RE | Admit: 2020-12-20 | Payer: 59 | Source: Ambulatory Visit

## 2021-08-24 ENCOUNTER — Encounter: Payer: Self-pay | Admitting: Obstetrics and Gynecology

## 2021-09-10 ENCOUNTER — Ambulatory Visit: Payer: 59 | Admitting: Obstetrics and Gynecology

## 2021-09-11 ENCOUNTER — Encounter: Payer: Self-pay | Admitting: Radiology

## 2021-09-11 ENCOUNTER — Ambulatory Visit (INDEPENDENT_AMBULATORY_CARE_PROVIDER_SITE_OTHER): Payer: 59 | Admitting: Radiology

## 2021-09-11 VITALS — BP 132/82

## 2021-09-11 DIAGNOSIS — N912 Amenorrhea, unspecified: Secondary | ICD-10-CM

## 2021-09-11 DIAGNOSIS — Z113 Encounter for screening for infections with a predominantly sexual mode of transmission: Secondary | ICD-10-CM

## 2021-09-11 DIAGNOSIS — A599 Trichomoniasis, unspecified: Secondary | ICD-10-CM

## 2021-09-11 DIAGNOSIS — Z3009 Encounter for other general counseling and advice on contraception: Secondary | ICD-10-CM

## 2021-09-11 DIAGNOSIS — N760 Acute vaginitis: Secondary | ICD-10-CM | POA: Diagnosis not present

## 2021-09-11 DIAGNOSIS — B3731 Acute candidiasis of vulva and vagina: Secondary | ICD-10-CM | POA: Diagnosis not present

## 2021-09-11 LAB — PREGNANCY, URINE: Preg Test, Ur: NEGATIVE

## 2021-09-11 LAB — WET PREP FOR TRICH, YEAST, CLUE

## 2021-09-11 MED ORDER — METRONIDAZOLE 500 MG PO TABS: 500.0000 mg | ORAL_TABLET | Freq: Two times a day (BID) | ORAL | 0 refills | Status: DC

## 2021-09-11 MED ORDER — NYSTATIN-TRIAMCINOLONE 100000-0.1 UNIT/GM-% EX OINT: 1.0000 | TOPICAL_OINTMENT | Freq: Two times a day (BID) | CUTANEOUS | 0 refills | Status: DC

## 2021-09-11 MED ORDER — FLUCONAZOLE 150 MG PO TABS: 150.0000 mg | ORAL_TABLET | ORAL | 0 refills | Status: DC

## 2021-09-11 MED ORDER — PHEXXI 1.8-1-0.4 % VA GEL: 5.0000 g | Freq: Every day | VAGINAL | 11 refills | Status: DC

## 2021-09-11 NOTE — Progress Notes (Signed)
   Crystal Rhodes 2000/09/27 [id number]   History:  21 y.o. G0 presents for STI screening. Last sexual encounter 3 weeks ago, no longer with her partner, they were not using condoms. She c/o vaginal discharge and external itching. Denies any burning. Know +HSV blood work, would like to discuss. Period is 3 weeks late as well. Would like a non hormonal BC option, did not like how she felt when she tried OCPs.  Gynecologic History Patient's last menstrual period was 08/01/2021 (exact date).   Contraception/Family planning: none Sexually active: yes   Obstetric History OB History  Gravida Para Term Preterm AB Living  0 0 0 0 0 0  SAB IAB Ectopic Multiple Live Births  0 0 0 0 0     The following portions of the patient's history were reviewed and updated as appropriate: allergies, current medications, past family history, past medical history, past social history, past surgical history, and problem list.  Review of Systems Pertinent items noted in HPI and remainder of comprehensive ROS otherwise negative.   Past medical history, past surgical history, family history and social history were all reviewed and documented in the EPIC chart.   Exam:  Vitals:   09/11/21 1132  BP: 132/82   There is no height or weight on file to calculate BMI.  General appearance:  Normal Abdominal  Soft,nontender, without masses, guarding or rebound.  Liver/spleen:  No organomegaly noted  Hernia:  None appreciated  Skin  Inspection:  Grossly normal Genitourinary   Inguinal/mons:  Normal without inguinal adenopathy  External genitalia:  erythema on vulva and groin  BUS/Urethra/Skene's glands:  Normal without masses or exudate  Vagina:  + discharge, thick white  Cervix:  Normal appearing without discharge or lesions  Uterus:  Normal in size, shape and contour.  Mobile, nontender  Adnexa/parametria:     Rt: Normal in size, without masses or tenderness.   Lt: Normal in size, without masses or  tenderness.  Anus and perineum: Normal   Crystal Rhodes, CMA present for exam  Assessment/Plan:   1. Acute vaginitis - WET PREP FOR TRICH, YEAST, CLUE  2. Trichomoniasis Former partner needs notification and treatment TOC 4 weeks - metroNIDAZOLE (FLAGYL) 500 MG tablet; Take 1 tablet (500 mg total) by mouth 2 (two) times daily.  Dispense: 14 tablet; Refill: 0  3. Amenorrhea - Pregnancy, urine; negative   4. Yeast vaginitis - fluconazole (DIFLUCAN) 150 MG tablet; Take 1 tablet (150 mg total) by mouth every 3 (three) days.  Dispense: 2 tablet; Refill: 0 - nystatin-triamcinolone ointment (MYCOLOG); Apply 1 Application topically 2 (two) times daily.  Dispense: 30 g; Refill: 0  5. Screening for STDs (sexually transmitted diseases) - SURESWAB CT/NG/T. 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 Encounter for counseling regarding initiation of other contraceptive measure Discussed all birth control options, would like something hormone free, Not interested in an IUD  - Lactic Ac-Citric Ac-Pot Bitart (PHEXXI) 1.8-1-0.4 % GEL; Place 5 g vaginally daily. As directed  Dispense: 120 g; Refill: 11    Will contact with results of STI screening and manage accordingly  Crystal Rhodes B WHNP-BC 11:52 AM 09/11/2021

## 2021-09-12 LAB — RPR: RPR Ser Ql: NONREACTIVE

## 2021-09-12 LAB — HIV ANTIBODY (ROUTINE TESTING W REFLEX): HIV 1&2 Ab, 4th Generation: NONREACTIVE

## 2021-09-12 LAB — SURESWAB CT/NG/T. VAGINALIS
C. trachomatis RNA, TMA: NOT DETECTED
N. gonorrhoeae RNA, TMA: NOT DETECTED
Trichomonas vaginalis RNA: DETECTED — AB

## 2021-09-12 LAB — HEPATITIS C ANTIBODY: Hepatitis C Ab: NONREACTIVE

## 2021-09-13 ENCOUNTER — Telehealth: Payer: Self-pay | Admitting: *Deleted

## 2021-09-13 NOTE — Telephone Encounter (Signed)
PA done via cover my meds for Phexxi 1.8-1.0.4% gel. Pending response from [employer].

## 2021-09-17 NOTE — Telephone Encounter (Signed)
PA approved from 09/13/21-09/14/22

## 2021-10-09 ENCOUNTER — Ambulatory Visit (INDEPENDENT_AMBULATORY_CARE_PROVIDER_SITE_OTHER): Payer: 59 | Admitting: Radiology

## 2021-10-09 VITALS — BP 132/88

## 2021-10-09 DIAGNOSIS — A599 Trichomoniasis, unspecified: Secondary | ICD-10-CM

## 2021-10-09 DIAGNOSIS — B3731 Acute candidiasis of vulva and vagina: Secondary | ICD-10-CM

## 2021-10-09 LAB — WET PREP FOR TRICH, YEAST, CLUE

## 2021-10-09 NOTE — Progress Notes (Signed)
      Subjective: Crystal Rhodes is a 21 y.o. female here for TOC trich, took all meds. Did not take the Diflucan for yeast she was prescribed at her last visit.    Review of Systems  Past Medical History:  Diagnosis Date   Anemia    STD (sexually transmitted disease)    chlamydia treated      Objective:  Today's Vitals   10/09/21 1101  BP: 132/88   There is no height or weight on file to calculate BMI.   -General: no acute distress -Vulva: without lesions or discharge -Vagina: discharge present, wet prep obtained -Cervix: no lesion or discharge, no CMT -Perineum: no lesions -Uterus: Mobile, non tender -Adnexa: no masses or tenderness   Microscopic wet-mount exam shows hyphae, budding yeast.   Chaperone offered and declined.  Assessment:/Plan:   1. Trichomonas infection resolved - WET PREP FOR TRICH, YEAST, CLUE  2. Yeast vaginitis Take diflucan she has filled but not taken yet    Avoid intercourse until symptoms are resolved. Safe sex encouraged. Avoid the use of soaps or perfumed products in the peri area. Avoid tub baths and sitting in sweaty or wet clothing for prolonged periods of time.

## 2021-10-27 ENCOUNTER — Encounter (HOSPITAL_BASED_OUTPATIENT_CLINIC_OR_DEPARTMENT_OTHER): Payer: Self-pay | Admitting: Emergency Medicine

## 2021-10-27 ENCOUNTER — Emergency Department (HOSPITAL_BASED_OUTPATIENT_CLINIC_OR_DEPARTMENT_OTHER): Payer: 59

## 2021-10-27 ENCOUNTER — Other Ambulatory Visit: Payer: Self-pay

## 2021-10-27 ENCOUNTER — Emergency Department (HOSPITAL_BASED_OUTPATIENT_CLINIC_OR_DEPARTMENT_OTHER)
Admission: EM | Admit: 2021-10-27 | Discharge: 2021-10-27 | Disposition: A | Discharge status: Home / Self Care | Payer: 59 | Attending: Emergency Medicine | Admitting: Emergency Medicine

## 2021-10-27 DIAGNOSIS — S91132A Puncture wound without foreign body of left great toe without damage to nail, initial encounter: Secondary | ICD-10-CM | POA: Diagnosis not present

## 2021-10-27 DIAGNOSIS — W273XXA Contact with needle (sewing), initial encounter: Secondary | ICD-10-CM | POA: Insufficient documentation

## 2021-10-27 DIAGNOSIS — S99922A Unspecified injury of left foot, initial encounter: Secondary | ICD-10-CM | POA: Diagnosis present

## 2021-10-27 DIAGNOSIS — M79675 Pain in left toe(s): Secondary | ICD-10-CM

## 2021-10-27 DIAGNOSIS — S90452A Superficial foreign body, left great toe, initial encounter: Secondary | ICD-10-CM

## 2021-10-27 MED ORDER — CEPHALEXIN 500 MG PO CAPS: 500.0000 mg | ORAL_CAPSULE | Freq: Four times a day (QID) | ORAL | 0 refills | Status: AC

## 2021-10-27 MED ORDER — HYDROCODONE-ACETAMINOPHEN 5-325 MG PO TABS: 1.0000 | ORAL_TABLET | ORAL | 0 refills | Status: DC | PRN

## 2021-10-27 MED ORDER — IBUPROFEN 400 MG PO TABS
400.0000 mg | ORAL_TABLET | Freq: Once | ORAL | Status: AC | PRN
  Administered 2021-10-27: 400 mg via ORAL
  Filled 2021-10-27: qty 1

## 2021-10-27 NOTE — ED Provider Notes (Signed)
[facility] EMERGENCY DEPARTMENT Provider Note   CSN: [id number] Arrival date & time: 10/27/21  1114     History  Chief Complaint  Patient presents with   Foreign Body in Crystal Rhodes is a 21 y.o. female.  The history is provided by the patient and medical records. No language interpreter was used.  Foreign Body Location:  Skin Suspected object:  Metal (sewing needle) Pain quality:  Aching Pain severity:  Moderate Duration:  1 day Timing:  Constant Progression:  Unchanged Chronicity:  New Exacerbated by: walking. Ineffective treatments:  None tried Associated symptoms: no abdominal pain, no congestion and no cough        Home Medications Prior to Admission medications   Medication Sig Start Date End Date Taking? Authorizing Provider  Lactic Ac-Citric Ac-Pot Bitart (PHEXXI) 1.8-1-0.4 % GEL Place 5 g vaginally daily. As directed Patient not taking: Reported on 10/09/2021 09/11/21   [name] B, NP  nystatin-triamcinolone ointment (MYCOLOG) Apply 1 Application topically 2 (two) times daily. 09/11/21   Chrzanowski, [name], NP      Allergies    Patient has no known allergies.    Review of Systems   Review of Systems  Constitutional:  Negative for chills and fever.  HENT:  Negative for congestion.   Respiratory:  Negative for cough and chest tightness.   Cardiovascular:  Negative for chest pain.  Gastrointestinal:  Negative for abdominal pain.  Genitourinary:  Negative for dysuria.  Musculoskeletal:  Negative for back pain.  Skin:  Positive for wound.  Neurological:  Negative for dizziness, weakness, light-headedness and headaches.  Psychiatric/Behavioral:  Negative for agitation.   All other systems reviewed and are negative.   Physical Exam Updated Vital Signs BP (!) 141/102   Pulse 82   Temp 99.2 F (37.3 C) (Oral)   Resp 16   Ht 5' 2.5" (1.588 m)   Wt 81.6 kg   LMP 09/20/2021 (Exact Date)   SpO2 97%   BMI 32.40 kg/m  Physical  Exam Vitals and nursing note reviewed.  Constitutional:      General: She is not in acute distress.    Appearance: She is well-developed. She is not ill-appearing or toxic-appearing.  HENT:     Head: Normocephalic and atraumatic.  Eyes:     Conjunctiva/sclera: Conjunctivae normal.  Cardiovascular:     Rate and Rhythm: Normal rate.     Heart sounds: No murmur heard. Pulmonary:     Effort: Pulmonary effort is normal.  Abdominal:     Palpations: Abdomen is soft.     Tenderness: There is no abdominal tenderness.  Musculoskeletal:        General: Tenderness and signs of injury present. No swelling.     Cervical back: Neck supple.     Right lower leg: No edema.     Left lower leg: No edema.     Comments: Patient has small puncture wound on the left great toe on the bottom.  No bleeding.  Ultrasound utilized and foreign body appears deeper in the toe and not near the surface.  Otherwise intact cap refill and patient can feel distal touch.  Pulses and strength and sensation intact otherwise.  Exam otherwise unremarkable  Skin:    General: Skin is warm and dry.     Capillary Refill: Capillary refill takes less than 2 seconds.     Findings: No bruising.  Neurological:     General: No focal deficit present.  Mental Status: She is alert.     Sensory: No sensory deficit.     Motor: No weakness.  Psychiatric:        Mood and Affect: Mood normal.     ED Results / Procedures / Treatments   Labs (all labs ordered are listed, but only abnormal results are displayed) Labs Reviewed - No data to display  EKG None  Radiology DG Foot Complete Left  Result Date: 10/27/2021 CLINICAL DATA:  Pain after a sewing needle was placed in the great toe of the left foot. EXAM: LEFT FOOT - COMPLETE 3+ VIEW COMPARISON:  None Available. FINDINGS: There is no evidence of fracture or dislocation. There is no evidence of arthropathy or other focal bone abnormality. A 19 mm long metallic foreign body is  seen in the soft tissues of the inferior aspect of the first digit near the distal phalanx, consistent with a sewing needle given the provided history. IMPRESSION: Sewing needle in the soft tissues of the inferior aspect of the first digit near the distal phalanx. No acute osseous injury. Electronically Signed   By: Romona Curls M.D.   On: 10/27/2021 11:58        Procedures Procedures    Medications Ordered in ED Medications  ibuprofen (ADVIL) tablet 400 mg (400 mg Oral Given 10/27/21 1141)    ED Course/ Medical Decision Making/ A&P                           Medical Decision Making Amount and/or Complexity of Data Reviewed Radiology: ordered.  Risk Prescription drug management.        Crystal Rhodes is a 21 y.o. female with a past medical history significant for previous anemia who presents with foreign body in the toe.  According to patient, her sister was doing some sewing yesterday and left a needle on the floor that the patient stepped on in the evening.  She reports that the needle broke in half and part of it was stuck in her foot.  She reports her tetanus is up-to-date but reports she has been having pain in the left great toe.  She tried to get it out but was unable to do so.  Otherwise she is trying to not put pressure or walk on it but has been doing some walking.  She denies any other injuries or other complaints.  She denies significant bleeding.  No redness or drainage.  No other signs of infection.  On exam, lungs clear and chest nontender.  Patient's foot was examined and she has intact DP and PT pulse as well as intact capillary refill in all the toes.  She had sensation distally but it felt slightly tingly due to the foreign body present.  There was a small puncture wound that when palpated I did not feel a foreign body.  X-ray was obtained confirming the presence of the part of the sewing needle.  A bedside ultrasound was also utilized to confirm the presence but  also confirm how deep it is and it does appear to be multiple millimeters deep into the skin and not amenable to simple bedside removal.  We offered more incision and exploration to try to remove it today however given the depth of it and her overall appearance we do feel that is reasonable to have her follow-up with podiatry.  I called and spoke to Dr. Gala Lewandowsky who agrees with seeing her in clinic tomorrow for foreign  body removal.  We will cover in bacitracin after it was washed and we will also start her on some antibiotics and pain medicine.  Patient agrees and it was wrapped and padded.  She understands to go to the appointment tomorrow and return if any symptoms change or worsen.  She will also watch for infections.  She had no other questions or concerns and was discharged in good condition.            Final Clinical Impression(s) / ED Diagnoses Final diagnoses:  Contact with sewing needle, initial encounter  Foreign body of skin of great toe, left, initial encounter  Great toe pain, left    Rx / DC Orders ED Discharge Orders          Ordered    cephALEXin (KEFLEX) 500 MG capsule  4 times daily        10/27/21 1422    HYDROcodone-acetaminophen (NORCO/VICODIN) 5-325 MG tablet  Every 4 hours PRN        10/27/21 1422            Clinical Impression: 1. Contact with sewing needle, initial encounter   2. Foreign body of skin of great toe, left, initial encounter   3. Great toe pain, left     Disposition: Discharge  Condition: Good  I have discussed the results, Dx and Tx plan with the pt(& family if present). He/she/they expressed understanding and agree(s) with the plan. Discharge instructions discussed at great length. Strict return precautions discussed and pt &/or family have verbalized understanding of the instructions. No further questions at time of discharge.    Discharge Medication List as of 10/27/2021  2:23 PM     START taking these medications    Details  cephALEXin (KEFLEX) 500 MG capsule Take 1 capsule (500 mg total) by mouth 4 (four) times daily for 7 days., Starting Sun 10/27/2021, Until Sun 11/03/2021, Normal    HYDROcodone-acetaminophen (NORCO/VICODIN) 5-325 MG tablet Take 1 tablet by mouth every 4 (four) hours as needed., Starting Sun 10/27/2021, Normal        Follow Up: Felecia Shelling, DPM [address] [place] [place] [address] [phone]     [facility] [place] [facility] DEPARTMENT [id number] [place] [address] [phone]       Anwen Cannedy, Canary Brim, MD 10/27/21 [phone]

## 2021-10-27 NOTE — ED Notes (Signed)
I&D tray at bedside.

## 2021-10-27 NOTE — ED Triage Notes (Signed)
Pt arrive pov, slow limping gait, endorses sewing needle in LT great toe yesterday. Swelling noted. Tetanus UTD per pt

## 2021-10-27 NOTE — ED Notes (Signed)
Pt discharged to home. Discharge instructions have been discussed with patient and/or family members. Pt verbally acknowledges understanding d/c instructions, and endorses comprehension to checkout at registration before leaving.  °

## 2021-10-27 NOTE — ED Notes (Signed)
ED Provider at bedside. 

## 2021-10-27 NOTE — Discharge Instructions (Signed)
Your history, exam, evaluation today are consistent with a foreign body in the soft tissue of your left great toe.  The images confirmed the part of the sewing needle in the toe and ultrasound revealed that it was deeper than the surface.  We had a shared decision-making conversation offering bedside exploration versus podiatry follow-up and I do feel that podiatry is the better option for management at this time.  We cleaned it and covered it with the bacitracin to help event infection and gave you prescription for antibiotics.  Your tetanus was already up-to-date as you reported.  Please use the pain medicine to help with symptoms and please go to the appointment with Dr. Amalia Hailey tomorrow.  Please call them tomorrow morning to confirm your appointment time.  If any symptoms change or worsen acutely, please return to the nearest emergency department.

## 2021-10-28 ENCOUNTER — Encounter: Payer: Self-pay | Admitting: Podiatry

## 2021-10-28 ENCOUNTER — Encounter: Payer: Self-pay | Admitting: *Deleted

## 2021-10-28 ENCOUNTER — Ambulatory Visit (INDEPENDENT_AMBULATORY_CARE_PROVIDER_SITE_OTHER): Payer: 59 | Admitting: Podiatry

## 2021-10-28 DIAGNOSIS — T81509A Unspecified complication of foreign body accidentally left in body following unspecified procedure, initial encounter: Secondary | ICD-10-CM | POA: Diagnosis not present

## 2021-10-30 NOTE — Progress Notes (Signed)
Subjective:   Patient ID: Crystal Rhodes, female   DOB: 21 y.o.   MRN: [id number]   HPI Patient presents with a very painful left big toe stating that she stepped on a sewing needle 2 days ago.  She went to the ER and they were not able to get it out and she is referred to [place] for having this done.  Patient does not smoke likes to be active   Review of Systems  All other systems reviewed and are negative.       Objective:  Physical Exam Vitals and nursing note reviewed.  Constitutional:      Appearance: She is well-developed.  Pulmonary:     Effort: Pulmonary effort is normal.  Musculoskeletal:        General: Normal range of motion.  Skin:    General: Skin is warm.  Neurological:     Mental Status: She is alert.     Neurovascular status intact muscle strength found to be adequate range of motion adequate with a portal of entry noted on the left hallux on the lateral side with some slight erythema surrounding the area with patient on cephalexin given and prescribed by the emergency room.  Very painful when pressed with inability to walk on the toe comfortably     Assessment:  Traumatic incident with a sewing needle which has been embedded into the left hallux and not apparent on the surface     Plan:  H&P x-ray reviewed I discussed possible that this we may have to go to surgical center to work on this but we will get a try to see if we can get it out here and I went ahead I anesthetized the left hallux 60 mg like Marcaine mixture sterile prep to the toe and using sterile sharp debridement I opened a small portal on the lateral side and utilizing sharp blunt dissection I was able to identify remove the sewing needle in toto.  Flush the area applied sterile dressing continue antibiotic and reduced activity and if any issues were to occur patient is to reappoint immediately  X-rays indicate there is a sewing needle of approximate 1.5 cm embedded into the left plantar hallux

## 2021-11-27 ENCOUNTER — Ambulatory Visit (INDEPENDENT_AMBULATORY_CARE_PROVIDER_SITE_OTHER): Payer: 59

## 2021-11-27 ENCOUNTER — Other Ambulatory Visit (HOSPITAL_COMMUNITY)
Admission: RE | Admit: 2021-11-27 | Discharge: 2021-11-27 | Disposition: A | Discharge status: Home / Self Care | Payer: 59 | Source: Ambulatory Visit | Attending: Family Medicine | Admitting: Family Medicine

## 2021-11-27 VITALS — BP 135/81 | HR 90 | Wt 177.0 lb

## 2021-11-27 DIAGNOSIS — Z113 Encounter for screening for infections with a predominantly sexual mode of transmission: Secondary | ICD-10-CM

## 2021-11-28 LAB — CERVICOVAGINAL ANCILLARY ONLY
Chlamydia: NEGATIVE
Comment: NEGATIVE
Comment: NEGATIVE
Comment: NORMAL
Neisseria Gonorrhea: NEGATIVE
Trichomonas: NEGATIVE

## 2021-11-28 NOTE — Progress Notes (Signed)
Here today to establish care and for STD screening. Declines blood testing, would like only vaginal swab today. Self swab completed. Will contact pt with any abnormal results. Pt does not desire pregnancy at this time; utilizing the fertility awareness method and condoms for contraception. Pt has not yet had first Pap smear. Reviewed cervical cancer screening recommendations; pt will schedule annual visit with Pap when her schedule allows.  Apolonio Schneiders RN 11/28/21

## 2021-12-17 ENCOUNTER — Ambulatory Visit (INDEPENDENT_AMBULATORY_CARE_PROVIDER_SITE_OTHER): Payer: 59

## 2021-12-17 VITALS — BP 126/78 | HR 83

## 2021-12-17 DIAGNOSIS — R350 Frequency of micturition: Secondary | ICD-10-CM | POA: Diagnosis not present

## 2021-12-17 MED ORDER — NITROFURANTOIN MONOHYD MACRO 100 MG PO CAPS: 100.0000 mg | ORAL_CAPSULE | Freq: Two times a day (BID) | ORAL | 0 refills | Status: DC

## 2021-12-17 NOTE — Progress Notes (Signed)
Pt reports concern of urinary urgency and frequency over the past few days. Denies any burning with urination, abnormal odor, or appearance of urine. UA is positive for trace hemoglobin. Will send for urine culture and treat with Macrobid 100 mg BID for 5 days per protocol.  Apolonio Schneiders RN 12/17/21

## 2021-12-18 LAB — POCT URINALYSIS DIP (DEVICE)
Bilirubin Urine: NEGATIVE
Glucose, UA: NEGATIVE mg/dL
Ketones, ur: NEGATIVE mg/dL
Leukocytes,Ua: NEGATIVE
Nitrite: NEGATIVE
Protein, ur: NEGATIVE mg/dL
Specific Gravity, Urine: >=1.03 (ref 1.005–1.030)
Urobilinogen, UA: 0.2 mg/dL (ref 0.0–1.0)
pH: 5.5 (ref 5.0–8.0)

## 2021-12-19 LAB — URINE CULTURE

## 2022-01-29 ENCOUNTER — Other Ambulatory Visit (HOSPITAL_COMMUNITY)
Admission: RE | Admit: 2022-01-29 | Discharge: 2022-01-29 | Disposition: A | Discharge status: Home / Self Care | Payer: 59 | Source: Ambulatory Visit | Attending: Certified Nurse Midwife | Admitting: Certified Nurse Midwife

## 2022-01-29 ENCOUNTER — Ambulatory Visit (INDEPENDENT_AMBULATORY_CARE_PROVIDER_SITE_OTHER): Payer: 59 | Admitting: Certified Nurse Midwife

## 2022-01-29 ENCOUNTER — Encounter: Payer: Self-pay | Admitting: Certified Nurse Midwife

## 2022-01-29 VITALS — BP 125/75 | HR 88 | Wt 175.3 lb

## 2022-01-29 DIAGNOSIS — Z01419 Encounter for gynecological examination (general) (routine) without abnormal findings: Secondary | ICD-10-CM

## 2022-01-29 DIAGNOSIS — Z124 Encounter for screening for malignant neoplasm of cervix: Secondary | ICD-10-CM | POA: Insufficient documentation

## 2022-01-29 DIAGNOSIS — R110 Nausea: Secondary | ICD-10-CM | POA: Diagnosis not present

## 2022-01-29 MED ORDER — ONDANSETRON 4 MG PO TBDP: 4.0000 mg | ORAL_TABLET | Freq: Three times a day (TID) | ORAL | 0 refills | Status: DC | PRN

## 2022-01-29 NOTE — Progress Notes (Signed)
ANNUAL EXAM Patient name: Mikaelah Trostle MRN [id number]  Date of birth: [birth year] Chief Complaint:   Gynecologic Exam  History of Present Illness:   Crystal Rhodes is a 21 y.o. G0P0000 African-American female being seen today for a routine annual exam.  Current complaints: None  Patient's last menstrual period was 12/23/2021 (exact date). Is currently [redacted]w[redacted]d pregnant, has an IAB scheduled for 02/14/21.  The pregnancy intention screening data noted above was reviewed. Potential methods of contraception were discussed. The patient elected to proceed with FAM or LAM; Female Condom (may want the copper IUD is going to consider).   Last pap never (age). Results were: N/A. H/O abnormal pap: no Last mammogram: Never (age). Results were: N/A. Family h/o breast cancer: no Last colonoscopy: Never (age). Results were: N/A. Family h/o colorectal cancer: no  Review of Systems:   Pertinent items noted in HPI and remainder of comprehensive ROS otherwise negative.   Pertinent History Reviewed:  Reviewed past medical,surgical, social and family history.  Reviewed problem list, medications and allergies.  Physical Assessment:   Vitals:   01/29/22 1626  BP: 125/75  Pulse: 88  Weight: 175 lb 4.8 oz (79.5 kg)   Body mass index is 31.55 kg/m.   Physical Examination:  General appearance - well appearing, and in no distress Mental status - alert, oriented to person, place, and time Psych:  She has a normal mood and affect Skin - warm and dry, normal color, no suspicious lesions noted Chest - effort normal, no problems with respiration noted Heart - normal rate and regular rhythm, warm and well perfused Neck:  midline trachea, no thyromegaly or nodules Breasts - breasts appear normal Abdomen - soft, nontender Pelvic - VULVA: normal appearing vulva with no masses, tenderness or lesions   VAGINA: normal appearing vagina with normal color and discharge, no lesions   CERVIX: normal appearing cervix  without discharge or lesions, no CMT Thin prep pap is done with HR HPV cotesting if ASCUS Extremities:  No swelling or varicosities noted  Chaperone present for exam  No results found for this or any previous visit (from the past 24 hour(s)).  Assessment & Plan:  1. Encounter for annual routine gynecological examination - Routine preventative health maintenance measures emphasized. - Using FAM right now, education provided on how to make it more effective. Considering a copper IUD, will schedule for placement if she desires. - Will follow up results of pap smear and manage accordingly. - Mammogram: @ 21yo, or sooner if problems - Colonoscopy: @ 21yo, or sooner if problems  2. Papanicolaou smear for cervical cancer screening - Cytology - PAP( [place])  3. Nausea - Is currently [redacted]w[redacted]d pregnant but has an IAB scheduled for 02/14/21 - Reviewed expected progression of IAB, gave copious reassurance and affirmation of her reproductive choices/decision making.  - Encouraged her to reach out throughout the process as needed, offered zofran for nausea after miso dosing. Can take ibuprofen for cramping.  - ondansetron (ZOFRAN-ODT) 4 MG disintegrating tablet; Take 1 tablet (4 mg total) by mouth every 8 (eight) hours as needed for nausea or vomiting.  Dispense: 15 tablet; Refill: 0  No orders of the defined types were placed in this encounter.  Meds:  Meds ordered this encounter  Medications   ondansetron (ZOFRAN-ODT) 4 MG disintegrating tablet    Sig: Take 1 tablet (4 mg total) by mouth every 8 (eight) hours as needed for nausea or vomiting.    Dispense:  15 tablet  Refill:  0    Order Specific Question:   Supervising Provider    Answer:   Reva Bores [[id number]]   Follow-up: Return in about 1 year (around 01/30/2023), or to change birth control methods, for ANN.  Bernerd Limbo, CNM 01/29/2022 5:11 PM

## 2022-02-05 ENCOUNTER — Ambulatory Visit: Payer: 59 | Admitting: *Deleted

## 2022-02-05 ENCOUNTER — Telehealth: Payer: Self-pay

## 2022-02-05 ENCOUNTER — Other Ambulatory Visit: Payer: 59

## 2022-02-05 ENCOUNTER — Ambulatory Visit: Payer: 59 | Attending: Certified Nurse Midwife

## 2022-02-05 VITALS — BP 134/71 | HR 91

## 2022-02-05 DIAGNOSIS — O3680X0 Pregnancy with inconclusive fetal viability, not applicable or unspecified: Secondary | ICD-10-CM | POA: Diagnosis present

## 2022-02-05 DIAGNOSIS — R1030 Lower abdominal pain, unspecified: Secondary | ICD-10-CM

## 2022-02-05 DIAGNOSIS — Z3A01 Less than 8 weeks gestation of pregnancy: Secondary | ICD-10-CM | POA: Diagnosis not present

## 2022-02-05 DIAGNOSIS — O26891 Other specified pregnancy related conditions, first trimester: Secondary | ICD-10-CM

## 2022-02-05 DIAGNOSIS — R102 Pelvic and perineal pain: Secondary | ICD-10-CM | POA: Insufficient documentation

## 2022-02-05 LAB — BETA HCG QUANT (REF LAB): hCG Quant: 2861 m[IU]/mL

## 2022-02-05 LAB — CYTOLOGY - PAP: Adequacy: ABSENT

## 2022-02-05 NOTE — Telephone Encounter (Addendum)
Pt reports right lower abdominal sharp pains, intermittent. Reviewed with Edd Arbour, CNM. [place] order placed to verify location of pregnancy; to be completed today. Provider also gives verbal order for stat HCG.

## 2022-03-13 HISTORY — PX: BREAST REDUCTION SURGERY: SHX8

## 2022-07-01 ENCOUNTER — Ambulatory Visit (INDEPENDENT_AMBULATORY_CARE_PROVIDER_SITE_OTHER): Payer: 59

## 2022-07-01 VITALS — BP 122/83 | HR 85

## 2022-07-01 DIAGNOSIS — R350 Frequency of micturition: Secondary | ICD-10-CM

## 2022-07-01 NOTE — Progress Notes (Unsigned)
Crystal Rhodes is here with concern of frequent urination. These symptoms have been present for ***.  Clean catch urine specimen obtained; UA ***  [name], RN 07/01/2022  3:04 PM

## 2022-07-02 LAB — POCT URINALYSIS DIP (DEVICE)
Bilirubin Urine: NEGATIVE
Glucose, UA: NEGATIVE mg/dL
Hgb urine dipstick: NEGATIVE
Ketones, ur: NEGATIVE mg/dL
Leukocytes,Ua: NEGATIVE
Nitrite: NEGATIVE
Protein, ur: NEGATIVE mg/dL
Specific Gravity, Urine: 1.02 (ref 1.005–1.030)
Urobilinogen, UA: 0.2 mg/dL (ref 0.0–1.0)
pH: 7.5 (ref 5.0–8.0)

## 2022-08-15 ENCOUNTER — Ambulatory Visit (INDEPENDENT_AMBULATORY_CARE_PROVIDER_SITE_OTHER): Payer: 59

## 2022-08-15 ENCOUNTER — Other Ambulatory Visit (HOSPITAL_COMMUNITY)
Admission: RE | Admit: 2022-08-15 | Discharge: 2022-08-15 | Disposition: A | Discharge status: Home / Self Care | Payer: 59 | Source: Ambulatory Visit | Attending: Certified Nurse Midwife | Admitting: Certified Nurse Midwife

## 2022-08-15 VITALS — BP 138/82 | HR 74

## 2022-08-15 DIAGNOSIS — N898 Other specified noninflammatory disorders of vagina: Secondary | ICD-10-CM | POA: Diagnosis not present

## 2022-08-15 MED ORDER — FLUCONAZOLE 150 MG PO TABS: 150.0000 mg | ORAL_TABLET | Freq: Once | ORAL | 0 refills | Status: AC

## 2022-08-15 NOTE — Progress Notes (Signed)
Crystal Rhodes is here with concern of vaginal and rectal itching. Recently had a period of constipation that has now resolved. Is concerned she may have a hemorrhoid. Feels extra tissue in the rectal/perineal area. Single Diflucan tablet sent to pharmacy for vaginal itching. Vaginal self swab colleted. Recommended Tucks pads OTC to trial for soothing of rectal itching. Also encouraged pt to monitor bowel movements with the goal of having soft bowel movement without straining. If irritation continues will schedule appt with Edd Arbour, CNM for exam.   [name], RN 08/19/2022  9:07 AM

## 2022-08-18 LAB — CERVICOVAGINAL ANCILLARY ONLY
Bacterial Vaginitis (gardnerella): NEGATIVE
Candida Glabrata: POSITIVE — AB
Candida Vaginitis: POSITIVE — AB
Chlamydia: NEGATIVE
Comment: NEGATIVE
Comment: NEGATIVE
Comment: NEGATIVE
Comment: NEGATIVE
Comment: NEGATIVE
Comment: NORMAL
Neisseria Gonorrhea: NEGATIVE
Trichomonas: NEGATIVE

## 2022-08-25 ENCOUNTER — Encounter: Payer: Self-pay | Admitting: Certified Nurse Midwife

## 2022-08-29 ENCOUNTER — Encounter: Payer: Self-pay | Admitting: Internal Medicine

## 2022-08-29 ENCOUNTER — Other Ambulatory Visit: Payer: Self-pay

## 2022-08-29 ENCOUNTER — Ambulatory Visit (INDEPENDENT_AMBULATORY_CARE_PROVIDER_SITE_OTHER): Payer: 59 | Admitting: Internal Medicine

## 2022-08-29 VITALS — BP 120/70 | HR 90 | Temp 98.5°F | Resp 16 | Ht 62.56 in | Wt 173.2 lb

## 2022-08-29 DIAGNOSIS — J302 Other seasonal allergic rhinitis: Secondary | ICD-10-CM

## 2022-08-29 DIAGNOSIS — J3089 Other allergic rhinitis: Secondary | ICD-10-CM

## 2022-08-29 DIAGNOSIS — L501 Idiopathic urticaria: Secondary | ICD-10-CM | POA: Diagnosis not present

## 2022-08-29 DIAGNOSIS — H1045 Other chronic allergic conjunctivitis: Secondary | ICD-10-CM | POA: Diagnosis not present

## 2022-08-29 NOTE — Progress Notes (Unsigned)
New Patient Note  RE: Crystal Rhodes MRN: [id number] DOB: [birth year] Date of Office Visit: 08/29/2022  Consult requested by: Irena Reichmann, DO Primary care provider: Irena Reichmann, DO  Chief Complaint: Allergy Testing, Establish Care, Urticaria (Random and cause unknown. Anywhere on the body and face.), and Angioedema  History of Present Illness: I had the pleasure of seeing [name] for initial evaluation at the [facility] on 08/29/2022. She is a 22 y.o. female, who is referred here by Irena Reichmann, DO for the evaluation of urticaria/angioedema .  History obtained from patient, chart review.  Rash: started 3 months , occurring ranges from 4-5/week to 1 every 2 weeks.  Associates pruritus, no dermatographism, 2 episodes of lip swelling  Denies other systemic symptoms including no respiratory, gastrointestinal or cardiovascular distress. No changes to personal care products, detergents, diet, etc Therapies tried: prednisone and benadryl as needed  Potential triggers: unknown, potentially hot showers  Does no associate fever, joint pain, joint swelling, weight loss.  Lesions resolve in within hours   No bruising on resolution. No recent illness. She did have a breast reduction in February.  She was taking percocet. Celebrex and ibuprofen for a week after surgery.  Pictures reviewed consistent with urticaria and angioedema     Assessment and Plan: B Arley Phenix is a 22 y.o. female with: Idiopathic urticaria - Plan: CBC With Diff/Platelet, CMP14+EGFR, Chronic Urticaria, TSH, Thyroid peroxidase antibody, Tryptase, C-reactive protein, Alpha-Gal Panel, Allergy Test  Seasonal and perennial allergic rhinitis  Other chronic allergic conjunctivitis of both eyes   Plan: Patient Instructions  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 to grass, weed, tree, mold, cat, roach  - this is defined as hives lasting more than 6 weeks without an identifiable  trigger - hives can be from a number of different sources including infections, allergies, vibration, temperature, pressure among many others other possible causes - often an identifiable cause is not determined - some potential triggers include: stress, illness, NSAIDs, aspirin, hormonal changes - you do not have any red flag symptoms to make [place] concerned about secondary causes of hives, but we will screen for these for reassurance with: CBC w diff, CMP, tryptase, TSH, hive panel, alpha-gal panel, inflammatory markers - approximately 50% of patients with chronic hives can have some associated swelling of the face/lips/eyelids (this is not a cause for alarm and does not typically progress onto systemic allergic reactions)  Therapy Plan:  - start zyrtec (cetirizine) 10mg  once daily - if hives are uncontrolled, increase zyrtec (cetirizine) to 10mg  twice daily - if hives remain uncontrolled, increase dose of zyrtec (cetirizine) to max dose of 20mg  (2 pills) twice daily- this is maximum dose - can increase or decrease dosing depending on symptom control to a maximum dose of 4 tablets of antihistamine daily. Wait until hives free for at least one month prior to decreasing dose.   - if hives are still uncontrolled with the above regimen, please arrange an appointment for discussion of Xolair (omalizumab)- an injectable medication for hives  Can use one of the following in place of zyrtec if desires: Claritin (loratadine) 10 mg, Xyzal (levocetirizine) 5 mg or Allegra (fexofenadine) 180 mg daily as needed   Chronic Rhinitis Seasonal and Perennial Allergic: - allergy testing today: grass, weed, tree, mold, cat, roach   - Prevention:  - allergen avoidance when possible - consider allergy shots as long term control of your symptoms by teaching your immune system to be more  tolerant of your allergy triggers  - Symptom control: - Consider Nasal Steroid Spray: Best results if used daily. - Options  include Flonase (fluticasone), Nasocort (triamcinolone), Nasonex (mometasome) 1- 2 sprays in each nostril daily.  - All can be purchased over-the-counter if not covered by insurance. - Continue Antihistamine: daily or daily as needed.   -Options include Zyrtec (Cetirizine) 10mg , Claritin (Loratadine) 10mg , Allegra (Fexofenadine) 180mg , or Xyzal (Levocetirinze) 5mg  - Can be purchased over-the-counter if not covered by insurance.  Follow up: 4 weeks   Thank you so much for letting me partake in your care today.  Don't hesitate to reach out if you have any additional concerns!  Ferol Luz, MD  [facility] [place] [place]     Reducing Pollen Exposure  The American Academy of Allergy, Asthma and Immunology suggests the following steps to reduce your exposure to pollen during allergy seasons.    Do not hang sheets or clothing out to dry; pollen may collect on these items. Do not mow lawns or spend time around freshly cut grass; mowing stirs up pollen. Keep windows closed at night.  Keep car windows closed while driving. Minimize morning activities outdoors, a time when pollen counts are usually at their highest. Stay indoors as much as possible when pollen counts or humidity is high and on windy days when pollen tends to remain in the air longer. Use air conditioning when possible.  Many air conditioners have filters that trap the pollen spores. Use a HEPA room air filter to remove pollen form the indoor air you breathe.  Control of Mold Allergen   Mold and fungi can grow on a variety of surfaces provided certain temperature and moisture conditions exist.  Outdoor molds grow on plants, decaying vegetation and soil.  The major outdoor mold, Alternaria and Cladosporium, are found in very high numbers during hot and dry conditions.  Generally, a late Summer - Fall peak is seen for common outdoor fungal spores.  Rain will temporarily lower outdoor mold spore count, but counts  rise rapidly when the rainy period ends.  The most important indoor molds are Aspergillus and Penicillium.  Dark, humid and poorly ventilated basements are ideal sites for mold growth.  The next most common sites of mold growth are the bathroom and the kitchen.  Outdoor (Seasonal) Mold Control  Use air conditioning and keep windows closed Avoid exposure to decaying vegetation. Avoid leaf raking. Avoid grain handling. Consider wearing a face mask if working in moldy areas.    Indoor (Perennial) Mold Control    Maintain humidity below 50%. Clean washable surfaces with 5% bleach solution. Remove sources e.g. contaminated carpets.    Control of Dog or Cat Allergen  Avoidance is the best way to manage a dog or cat allergy. If you have a dog or cat and are allergic to dog or cats, consider removing the dog or cat from the home. If you have a dog or cat but don't want to find it a new home, or if your family wants a pet even though someone in the household is allergic, here are some strategies that may help keep symptoms at bay:  Keep the pet out of your bedroom and restrict it to only a few rooms. Be advised that keeping the dog or cat in only one room will not limit the allergens to that room. Don't pet, hug or kiss the dog or cat; if you do, wash your hands with soap and water. High-efficiency particulate  air (HEPA) cleaners run continuously in a bedroom or living room can reduce allergen levels over time. Regular use of a high-efficiency vacuum cleaner or a central vacuum can reduce allergen levels. Giving your dog or cat a bath at least once a week can reduce airborne allergen.  Control of Cockroach Allergen  Cockroach allergen has been identified as an important cause of acute attacks of asthma, especially in urban settings.  There are fifty-five species of cockroach that exist in the [place], however only three, the [place], [place] species produce allergen that  can affect patients with Asthma.  Allergens can be obtained from fecal particles, egg casings and secretions from cockroaches.    Remove food sources. Reduce access to water. Seal access and entry points. Spray runways with 0.5-1% Diazinon or Chlorpyrifos Blow boric acid power under stoves and refrigerator. Place bait stations (hydramethylnon) at feeding sites.   No orders of the defined types were placed in this encounter.  Lab Orders         CBC With Diff/Platelet         CMP14+EGFR         Chronic Urticaria         TSH         Thyroid peroxidase antibody         Tryptase         C-reactive protein         Alpha-Gal Panel      Other allergy screening: Asthma: yes Rhino conjunctivitis:  Seasonally worse in spring  presents as rhinnorhea, sneezing, red itchy eyes  Denies animal triggers, treats with zyrtec.  Denies any sinus surgeries.  No prior allergy testing.  Food allergy: no Medication allergy: no Hymenoptera allergy: no Urticaria: yes Eczema:yes: childhood, she has since outgrown  History of recurrent infections suggestive of immunodeficency: no  Diagnostics: Skin Testing: Environmental allergy panel and select foods.  adequate controls  Results interpreted by myself and discussed with patient/family.  Airborne Adult Perc - 08/29/22 1348     Time Antigen Placed 1403    Allergen Manufacturer Waynette Buttery    Location Back    Number of Test 55    1. Control-Buffer 50% Glycerol Negative    2. Control-Histamine 4+    3. Bahia Negative    4. [place] 4+    5. Johnson Negative    6. Kentucky Blue 4+    7. Meadow Fescue Negative    8. Perennial Rye 4+    9. Timothy 4+    10. Ragweed Mix 4+    11. Cocklebur 4+    12. Plantain,  English 4+    13. Baccharis 3+    14. Dog Fennel Negative    15. Guernsey Thistle 5+    16. Lamb's Quarters 3+    17. Sheep Sorrell 3+    18. Rough Pigweed 4+    19. Marsh Elder, Rough 3+    20. Mugwort, Common 3+    21. Box, Elder Negative     22. Cedar, red 3+    23. Sweet Gum 3+    24. Pecan Pollen 4+    25. Pine Mix Negative    26. Walnut, Black Pollen 4+    27. Red Mulberry Negative    28. Ash Mix 4+    29. Birch Mix 4+    30. Beech American 4+    31. Cottonwood, [place] Negative    32. Hickory, White 4+    33. Maple  Mix Negative    34. Oak, [place] Mix 4+    35. Sycamore Eastern 3+    36. Alternaria Alternata 3+    37. Cladosporium Herbarum 3+    38. Aspergillus Mix 3+    39. Penicillium Mix 3+    40. Bipolaris Sorokiniana (Helminthosporium) 3+    41. Drechslera Spicifera (Curvularia) Negative    42. Mucor Plumbeus Negative    43. Fusarium Moniliforme Negative    44. Aureobasidium Pullulans (pullulara) 3+    45. Rhizopus Oryzae Negative    46. Botrytis Cinera Negative    47. Epicoccum Nigrum Negative    48. Phoma Betae Negative    49. Dust Mite Mix Negative    50. Cat Hair 10,000 BAU/ml 3+    51.  Dog Epithelia Negative    52. Mixed Feathers Negative    53. Horse Epithelia Negative    54. Cockroach, German 4+    55. Tobacco Leaf Negative             13 Food Perc - 08/29/22 1348       Test Information   Time Antigen Placed 1404    Allergen Manufacturer Waynette Buttery    Location Back    Number of allergen test 13      Food   1. Peanut Negative    2. Soybean Negative    3. Wheat Negative    4. Sesame Negative    5. Milk, Cow Negative    6. Casein Negative    7. Egg White, Chicken Negative    8. Shellfish Mix Negative    9. Fish Mix Negative    10. Cashew Negative    11. Walnut Food Negative    12. Almond Negative    13. Hazelnut Negative             Past Medical History: Patient Active Problem List   Diagnosis Date Noted   History of anemia 09/21/2020   Past Medical History:  Diagnosis Date   Anemia    Angio-edema    Eczema    STD (sexually transmitted disease)    chlamydia treated   Urticaria    Past Surgical History: Past Surgical History:  Procedure Laterality Date    BREAST REDUCTION SURGERY  03/2022   EYE SURGERY     EYE SURGERY  2006   Medication List:  No current outpatient medications on file.   No current facility-administered medications for this visit.   Allergies: No Known Allergies Social History: Social History   Socioeconomic History   Marital status: Single    Spouse name: Not on file   Number of children: Not on file   Years of education: Not on file   Highest education level: Not on file  Occupational History   Not on file  Tobacco Use   Smoking status: Never    Passive exposure: Past   Smokeless tobacco: Never  Vaping Use   Vaping status: Some Days  Substance and Sexual Activity   Alcohol use: Yes    Comment: occ   Drug use: No   Sexual activity: Yes    Partners: Male    Birth control/protection: None  Other Topics Concern   Not on file  Social History Narrative   Not on file   Social Determinants of Health   Financial Resource Strain: Not on file  Food Insecurity: Not on file  Transportation Needs: Not on file  Physical Activity: Not on file  Stress: Not on file  Social Connections: Not on file   Lives in a [facility], no roaches in the house and bed is 2 feet off the floor, no dust mite precautions.  Not exposed to fumes, chemicals or dust.  Home is not near an interstate or industrial area Smoking: No Exposure Occupation: Equities trader HistorySurveyor, minerals in the house: no Engineer, civil (consulting) in the family room: yes Carpet in the bedroom: yes Heating: electric Cooling: central Pet: no  Family History: Family History  Problem Relation Age of Onset   Hypertension Mother    Thyroid disease Mother    Diabetes Father    Asthma Sister    Asthma Sister    Cancer Paternal Aunt      ROS: All others negative except as noted per HPI.   Objective: BP 120/70   Pulse 90   Temp 98.5 F (36.9 C) (Temporal)   Resp 16   Ht 5' 2.56" (1.589 m)   Wt 173 lb 3.2 oz (78.6 kg)   LMP  (LMP Unknown)   SpO2 100%    BMI 31.12 kg/m  Body mass index is 31.12 kg/m.  General Appearance:  Alert, cooperative, no distress, appears stated age  Head:  Normocephalic, without obvious abnormality, atraumatic  Eyes:  Conjunctiva clear, EOM's intact  Nose: Nares normal,  erythematous nasal mucosa, no visible anterior polyps, and septum midline  Throat: Lips, tongue normal; teeth and gums normal, normal posterior oropharynx  Neck: Supple, symmetrical  Lungs:   clear to auscultation bilaterally, Respirations unlabored, no coughing  Heart:  regular rate and rhythm and no murmur, Appears well perfused  Extremities: No edema  Skin: Skin color, texture, turgor normal, no rashes or lesions on visualized portions of skin  Neurologic: No gross deficits   The plan was reviewed with the patient/family, and all questions/concerned were addressed.  It was my pleasure to see B Arley Phenix today and participate in her care. Please feel free to contact me with any questions or concerns.  Sincerely,  Ferol Luz, MD Allergy & Immunology  [facility] office: [phone] [facility] office: [phone]

## 2022-08-29 NOTE — Patient Instructions (Addendum)
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 to grass, weed, tree, mold, cat, roach  - this is defined as hives lasting more than 6 weeks without an identifiable trigger - hives can be from a number of different sources including infections, allergies, vibration, temperature, pressure among many others other possible causes - often an identifiable cause is not determined - some potential triggers include: stress, illness, NSAIDs, aspirin, hormonal changes - you do not have any red flag symptoms to make [place] concerned about secondary causes of hives, but we will screen for these for reassurance with: CBC w diff, CMP, tryptase, TSH, hive panel, alpha-gal panel, inflammatory markers - approximately 50% of patients with chronic hives can have some associated swelling of the face/lips/eyelids (this is not a cause for alarm and does not typically progress onto systemic allergic reactions)  Therapy Plan:  - start zyrtec (cetirizine) 10mg  once daily - if hives are uncontrolled, increase zyrtec (cetirizine) to 10mg  twice daily - if hives remain uncontrolled, increase dose of zyrtec (cetirizine) to max dose of 20mg  (2 pills) twice daily- this is maximum dose - can increase or decrease dosing depending on symptom control to a maximum dose of 4 tablets of antihistamine daily. Wait until hives free for at least one month prior to decreasing dose.   - if hives are still uncontrolled with the above regimen, please arrange an appointment for discussion of Xolair (omalizumab)- an injectable medication for hives  Can use one of the following in place of zyrtec if desires: Claritin (loratadine) 10 mg, Xyzal (levocetirizine) 5 mg or Allegra (fexofenadine) 180 mg daily as needed   Chronic Rhinitis Seasonal and Perennial Allergic: - allergy testing today: grass, weed, tree, mold, cat, roach   - Prevention:  - allergen avoidance when possible - consider allergy shots as long term control of your  symptoms by teaching your immune system to be more tolerant of your allergy triggers  - Symptom control: - Consider Nasal Steroid Spray: Best results if used daily. - Options include Flonase (fluticasone), Nasocort (triamcinolone), Nasonex (mometasome) 1- 2 sprays in each nostril daily.  - All can be purchased over-the-counter if not covered by insurance. - Continue Antihistamine: daily or daily as needed.   -Options include Zyrtec (Cetirizine) 10mg , Claritin (Loratadine) 10mg , Allegra (Fexofenadine) 180mg , or Xyzal (Levocetirinze) 5mg  - Can be purchased over-the-counter if not covered by insurance.  Follow up: 4 weeks   Thank you so much for letting me partake in your care today.  Don't hesitate to reach out if you have any additional concerns!  Ferol Luz, MD  [facility] [place], [place]     Reducing Pollen Exposure  The American Academy of Allergy, Asthma and Immunology suggests the following steps to reduce your exposure to pollen during allergy seasons.    Do not hang sheets or clothing out to dry; pollen may collect on these items. Do not mow lawns or spend time around freshly cut grass; mowing stirs up pollen. Keep windows closed at night.  Keep car windows closed while driving. Minimize morning activities outdoors, a time when pollen counts are usually at their highest. Stay indoors as much as possible when pollen counts or humidity is high and on windy days when pollen tends to remain in the air longer. Use air conditioning when possible.  Many air conditioners have filters that trap the pollen spores. Use a HEPA room air filter to remove pollen form the indoor air you breathe.  Control of Mold Allergen  Mold and fungi can grow on a variety of surfaces provided certain temperature and moisture conditions exist.  Outdoor molds grow on plants, decaying vegetation and soil.  The major outdoor mold, Alternaria and Cladosporium, are found in very high  numbers during hot and dry conditions.  Generally, a late Summer - Fall peak is seen for common outdoor fungal spores.  Rain will temporarily lower outdoor mold spore count, but counts rise rapidly when the rainy period ends.  The most important indoor molds are Aspergillus and Penicillium.  Dark, humid and poorly ventilated basements are ideal sites for mold growth.  The next most common sites of mold growth are the bathroom and the kitchen.  Outdoor (Seasonal) Mold Control  Use air conditioning and keep windows closed Avoid exposure to decaying vegetation. Avoid leaf raking. Avoid grain handling. Consider wearing a face mask if working in moldy areas.    Indoor (Perennial) Mold Control    Maintain humidity below 50%. Clean washable surfaces with 5% bleach solution. Remove sources e.g. contaminated carpets.    Control of Dog or Cat Allergen  Avoidance is the best way to manage a dog or cat allergy. If you have a dog or cat and are allergic to dog or cats, consider removing the dog or cat from the home. If you have a dog or cat but don't want to find it a new home, or if your family wants a pet even though someone in the household is allergic, here are some strategies that may help keep symptoms at bay:  Keep the pet out of your bedroom and restrict it to only a few rooms. Be advised that keeping the dog or cat in only one room will not limit the allergens to that room. Don't pet, hug or kiss the dog or cat; if you do, wash your hands with soap and water. High-efficiency particulate air (HEPA) cleaners run continuously in a bedroom or living room can reduce allergen levels over time. Regular use of a high-efficiency vacuum cleaner or a central vacuum can reduce allergen levels. Giving your dog or cat a bath at least once a week can reduce airborne allergen.  Control of Cockroach Allergen  Cockroach allergen has been identified as an important cause of acute attacks of asthma,  especially in urban settings.  There are fifty-five species of cockroach that exist in the [place], however only three, the [place], [place] species produce allergen that can affect patients with Asthma.  Allergens can be obtained from fecal particles, egg casings and secretions from cockroaches.    Remove food sources. Reduce access to water. Seal access and entry points. Spray runways with 0.5-1% Diazinon or Chlorpyrifos Blow boric acid power under stoves and refrigerator. Place bait stations (hydramethylnon) at feeding sites.

## 2022-08-30 LAB — ALPHA-GAL PANEL

## 2022-08-30 LAB — CMP14+EGFR
ALT: 17 IU/L (ref 0–32)
Alkaline Phosphatase: 79 IU/L (ref 44–121)
BUN: 11 mg/dL (ref 6–20)
CO2: 19 mmol/L — ABNORMAL LOW (ref 20–29)
Creatinine, Ser: 0.94 mg/dL (ref 0.57–1.00)
Potassium: 4.2 mmol/L (ref 3.5–5.2)
Sodium: 137 mmol/L (ref 134–144)
eGFR: 87 mL/min/{1.73_m2} (ref >59)

## 2022-08-30 LAB — CBC WITH DIFF/PLATELET
Immature Grans (Abs): 0 10*3/uL (ref 0.0–0.1)
Immature Granulocytes: 0 %
Lymphocytes Absolute: 2.7 10*3/uL (ref 0.7–3.1)
Monocytes Absolute: 0.4 10*3/uL (ref 0.1–0.9)
RBC: 4.62 x10E6/uL (ref 3.77–5.28)
WBC: 5.7 10*3/uL (ref 3.4–10.8)

## 2022-09-01 LAB — ALPHA-GAL PANEL

## 2022-09-01 LAB — C-REACTIVE PROTEIN: CRP: 9 mg/L (ref 0–10)

## 2022-09-01 LAB — CMP14+EGFR
AST: 25 IU/L (ref 0–40)
BUN/Creatinine Ratio: 12 (ref 9–23)
Bilirubin Total: 0.3 mg/dL (ref 0.0–1.2)
Chloride: 102 mmol/L (ref 96–106)

## 2022-09-01 LAB — CBC WITH DIFF/PLATELET
Basophils Absolute: 0 10*3/uL (ref 0.0–0.2)
EOS (ABSOLUTE): 0 10*3/uL (ref 0.0–0.4)
Hematocrit: 32 % — ABNORMAL LOW (ref 34.0–46.6)
MCH: 19.3 pg — ABNORMAL LOW (ref 26.6–33.0)
Monocytes: 7 %
Neutrophils Absolute: 2.6 10*3/uL (ref 1.4–7.0)
Platelets: 306 10*3/uL (ref 150–450)

## 2022-09-01 LAB — TSH: TSH: 0.626 u[IU]/mL (ref 0.450–4.500)

## 2022-09-02 LAB — CBC WITH DIFF/PLATELET
Basos: 1 %
Eos: 1 %
Hemoglobin: 8.9 g/dL — ABNORMAL LOW (ref 11.1–15.9)
Lymphs: 45 %
MCHC: 27.8 g/dL — ABNORMAL LOW (ref 31.5–35.7)
MCV: 69 fL — ABNORMAL LOW (ref 79–97)
Neutrophils: 46 %
RDW: 18.3 % — ABNORMAL HIGH (ref 11.7–15.4)

## 2022-09-02 LAB — THYROID PEROXIDASE ANTIBODY: Thyroperoxidase Ab SerPl-aCnc: 14 IU/mL (ref 0–34)

## 2022-09-02 LAB — TRYPTASE: Tryptase: 5.1 ug/L (ref 2.2–13.2)

## 2022-09-02 LAB — CMP14+EGFR
Albumin: 4.7 g/dL (ref 4.0–5.0)
Calcium: 9.4 mg/dL (ref 8.7–10.2)
Globulin, Total: 3.5 g/dL (ref 1.5–4.5)
Glucose: 91 mg/dL (ref 70–99)
Total Protein: 8.2 g/dL (ref 6.0–8.5)

## 2022-09-02 LAB — CHRONIC URTICARIA

## 2022-09-02 LAB — ALPHA-GAL PANEL

## 2022-09-10 NOTE — Progress Notes (Signed)
I reviewed the bloodwork. Blood work showed likely iron deficiency anemia.  She should follow up with her primary care about this.   Other blood work showed:    kidney function, liver function, electrolytes, thyroid, autoimmune screener, inflammation markers, chronic urticaria index (checks for autoantibodies that trigger mast cells), tryptase (checks for mast cell issues) and alpha gal (checks for red meat allergy) were all normal which is great.

## 2022-09-17 ENCOUNTER — Other Ambulatory Visit (HOSPITAL_COMMUNITY)
Admission: RE | Admit: 2022-09-17 | Discharge: 2022-09-17 | Disposition: A | Discharge status: Home / Self Care | Payer: 59 | Source: Ambulatory Visit

## 2022-09-17 ENCOUNTER — Ambulatory Visit (INDEPENDENT_AMBULATORY_CARE_PROVIDER_SITE_OTHER): Payer: 59 | Admitting: Certified Nurse Midwife

## 2022-09-17 DIAGNOSIS — B379 Candidiasis, unspecified: Secondary | ICD-10-CM | POA: Diagnosis not present

## 2022-09-17 DIAGNOSIS — N898 Other specified noninflammatory disorders of vagina: Secondary | ICD-10-CM | POA: Diagnosis not present

## 2022-09-17 MED ORDER — VALACYCLOVIR HCL 1 G PO TABS: 1000.0000 mg | ORAL_TABLET | Freq: Two times a day (BID) | ORAL | 0 refills | Status: DC

## 2022-09-17 NOTE — Progress Notes (Signed)
History:  Ms. Crystal Rhodes is a 22 y.o. G1P0010 who presents to clinic today for a painful area on her labia. She noticed it within the past few days, says it feels like a small cut. Gets bikini waxes, last one was 3 weeks ago. Was recently treated for candida glabrata with cessation of symptoms. No new sexual partners. No other physical complaints.   The following portions of the patient's history were reviewed and updated as appropriate: allergies, current medications, family history, past medical history, social history, past surgical history and problem list.  Review of Systems:  Pertinent items noted in HPI and remainder of comprehensive ROS otherwise negative.   Objective:  Physical Exam  Physical Exam Vitals and nursing note reviewed.  Constitutional:      Appearance: Normal appearance.  Genitourinary:    Labia:        Right: Tenderness and lesion present.      Vagina: Vaginal discharge present.    Neurological:     Mental Status: She is alert.    Labs and Imaging No results found for this or any previous visit (from the past 24 hour(s)).  No results found.   Assessment & Plan:  1. Vaginal irritation - Cervicovaginal ancillary only( [facility])  2. Vaginal lesion - Suspect either folliculitis or HSV, culture collected and will prescribe Valtrex for primary infection while waiting for the culture to result.  - Herpes simplex virus culture - valACYclovir (VALTREX) 1000 MG tablet; Take 1 tablet (1,000 mg total) by mouth 2 (two) times daily.  Dispense: 20 tablet; Refill: 0  Follow up PRN or for annual exam.  Bernerd Limbo, CNM 09/17/2022 8:51 PM

## 2022-09-23 ENCOUNTER — Encounter: Payer: Self-pay | Admitting: Certified Nurse Midwife

## 2022-09-23 DIAGNOSIS — B009 Herpesviral infection, unspecified: Secondary | ICD-10-CM | POA: Insufficient documentation

## 2022-09-23 MED ORDER — MICONAZOLE NITRATE 2 % VA CREA: 1.0000 | TOPICAL_CREAM | Freq: Every day | VAGINAL | 2 refills | Status: DC

## 2022-09-23 NOTE — Addendum Note (Signed)
Addended by: Edd Arbour on: 09/23/2022 03:44 PM   Modules accepted: Orders

## 2022-09-24 ENCOUNTER — Encounter: Payer: Self-pay | Admitting: Certified Nurse Midwife

## 2022-09-24 ENCOUNTER — Ambulatory Visit (INDEPENDENT_AMBULATORY_CARE_PROVIDER_SITE_OTHER): Payer: 59 | Admitting: Certified Nurse Midwife

## 2022-09-24 VITALS — BP 128/81 | HR 87 | Wt 169.0 lb

## 2022-09-24 DIAGNOSIS — B379 Candidiasis, unspecified: Secondary | ICD-10-CM

## 2022-09-24 DIAGNOSIS — B009 Herpesviral infection, unspecified: Secondary | ICD-10-CM | POA: Diagnosis not present

## 2022-09-24 MED ORDER — VALACYCLOVIR HCL 500 MG PO TABS: 500.0000 mg | ORAL_TABLET | Freq: Every day | ORAL | 12 refills | Status: DC

## 2022-09-24 NOTE — Progress Notes (Signed)
History:  Crystal Rhodes is a 22 y.o. G1P0010 who presents to clinic today for evaluation of HSV lesion, wants to be sure it is cleared up.   The following portions of the patient's history were reviewed and updated as appropriate: allergies, current medications, family history, past medical history, social history, past surgical history and problem list.  Review of Systems:  Pertinent items noted in HPI and remainder of comprehensive ROS otherwise negative.   Objective:  Physical Exam BP 128/81   Pulse 87   Wt 169 lb (76.7 kg)   LMP 08/30/2022 (Approximate)   Breastfeeding No   BMI 30.36 kg/m  Physical Exam Vitals and nursing note reviewed.  Constitutional:      Appearance: Normal appearance. She is normal weight.  Cardiovascular:     Rate and Rhythm: Normal rate and regular rhythm.  Abdominal:     Palpations: Abdomen is soft.     Tenderness: There is no abdominal tenderness.  Genitourinary:    General: Normal vulva.     Comments: Lesion resolved Musculoskeletal:        General: Normal range of motion.  Skin:    General: Skin is warm and dry.     Capillary Refill: Capillary refill takes less than 2 seconds.  Neurological:     Mental Status: She is alert and oriented to person, place, and time.  Psychiatric:        Mood and Affect: Mood normal.        Behavior: Behavior normal.    Labs and Imaging Culture positive for HSV2   Assessment & Plan:  1. 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 Can increase to twice a day for 5 days in the event of a recurrence  Dispense: 30 tablet; Refill: 12  2. Candida glabrata infection - Miconazole sent to pharmacy  Follow up PRN or for annual exam  Bernerd Limbo, CNM 09/24/2022 5:54 PM

## 2022-10-10 ENCOUNTER — Other Ambulatory Visit: Payer: Self-pay

## 2022-10-10 ENCOUNTER — Ambulatory Visit (INDEPENDENT_AMBULATORY_CARE_PROVIDER_SITE_OTHER): Payer: 59 | Admitting: Internal Medicine

## 2022-10-10 ENCOUNTER — Encounter: Payer: Self-pay | Admitting: Internal Medicine

## 2022-10-10 VITALS — BP 120/82 | HR 92 | Temp 98.5°F | Ht 62.56 in | Wt 164.6 lb

## 2022-10-10 DIAGNOSIS — J302 Other seasonal allergic rhinitis: Secondary | ICD-10-CM | POA: Diagnosis not present

## 2022-10-10 DIAGNOSIS — J3089 Other allergic rhinitis: Secondary | ICD-10-CM | POA: Diagnosis not present

## 2022-10-10 DIAGNOSIS — H1045 Other chronic allergic conjunctivitis: Secondary | ICD-10-CM | POA: Diagnosis not present

## 2022-10-10 DIAGNOSIS — L501 Idiopathic urticaria: Secondary | ICD-10-CM

## 2022-10-10 NOTE — Patient Instructions (Addendum)
Chronic Idiopathic Urticaria:  -Resolved, stop Zyrtec 10 mg daily.  Can restart if urticaria or itch returns  Chronic Rhinitis Seasonal and Perennial Allergic: - allergy testing: grass, weed, tree, mold, cat, roach   - Prevention:  - allergen avoidance when possible - consider allergy shots as long term control of your symptoms by teaching your immune system to be more tolerant of your allergy triggers  - Symptom control: - Consider Nasal Steroid Spray: Best results if used daily. - Options include Flonase (fluticasone), Nasocort (triamcinolone), Nasonex (mometasome) 1- 2 sprays in each nostril daily.  - All can be purchased over-the-counter if not covered by insurance. - Continue Antihistamine: daily or daily as needed.   -Options include Zyrtec (Cetirizine) 10mg , Claritin (Loratadine) 10mg , Allegra (Fexofenadine) 180mg , or Xyzal (Levocetirinze) 5mg  - Can be purchased over-the-counter if not covered by insurance.  Follow up: 4 weeks   Thank you so much for letting me partake in your care today.  Don't hesitate to reach out if you have any additional concerns!  Ferol Luz, MD  [facility] [place], [place]

## 2022-10-10 NOTE — Progress Notes (Signed)
Follow Up Note  RE: Crystal Rhodes MRN: [id number] DOB: 06/22/2000 Date of Office Visit: 10/10/2022  Referring provider: No ref. provider found Primary care provider: Irena Reichmann, DO  Chief Complaint: Follow-up (No concerns)  History of Present Illness: I had the pleasure of seeing Crystal Rhodes for a follow up visit at the [facility] on 10/10/2022. She is a 22 y.o. female, who is being followed for urticaria . Her previous allergy office visit was on 08/29/22 with Dr. Marlynn Perking. Today is a regular follow up visit.  History obtained from patient, chart review.  Since last visit she has not had any breakthrough urticaria or itch.  Controlled with Zyrtec 10 mg daily.  No breakthrough nasal or ocular symptoms.  Has not needed her Flonase no adverse effects of medications.  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  She is open stopping her antihistamines now.  Assessment and Plan: Crystal Rhodes is a 22 y.o. female with: Idiopathic urticaria  Seasonal and perennial allergic rhinitis  Other chronic allergic conjunctivitis of both eyes   Plan: Patient Instructions  Chronic Idiopathic Urticaria:  -Resolved, stop Zyrtec 10 mg daily.  Can restart if urticaria or itch returns  Chronic Rhinitis Seasonal and Perennial Allergic: - allergy testing: grass, weed, tree, mold, cat, roach   - Prevention:  - allergen avoidance when possible - consider allergy shots as long term control of your symptoms by teaching your immune system to be more tolerant of your allergy triggers  - Symptom control: - Consider Nasal Steroid Spray: Best results if used daily. - Options include Flonase (fluticasone), Nasocort (triamcinolone), Nasonex (mometasome) 1- 2 sprays in each nostril daily.  - All can be purchased over-the-counter if not covered by insurance. - Continue Antihistamine: daily or daily as needed.   -Options include Zyrtec (Cetirizine) 10mg , Claritin (Loratadine) 10mg , Allegra  (Fexofenadine) 180mg , or Xyzal (Levocetirinze) 5mg  - Can be purchased over-the-counter if not covered by insurance.  Follow up: 4 weeks   Thank you so much for letting me partake in your care today.  Don't hesitate to reach out if you have any additional concerns!  Ferol Luz, MD  [facility] [place], [place]      No orders of the defined types were placed in this encounter.   Lab Orders  No laboratory test(s) ordered today   Diagnostics: None done   Medication List:  Current Outpatient Medications  Medication Sig Dispense Refill   valACYclovir (VALTREX) 500 MG tablet Take 1 tablet (500 mg total) by mouth daily. Can increase to twice a day for 5 days in the event of a recurrence 30 tablet 12   miconazole (MONISTAT 7) 2 % vaginal cream Place 1 Applicatorful vaginally at bedtime. Apply for seven nights 30 g 2   valACYclovir (VALTREX) 1000 MG tablet Take 1 tablet (1,000 mg total) by mouth 2 (two) times daily. 20 tablet 0   No current facility-administered medications for this visit.   Allergies: No Known Allergies I reviewed her past medical history, social history, family history, and environmental history and no significant changes have been reported from her previous visit.  ROS: All others negative except as noted per HPI.   Objective: BP 120/82   Pulse 92   Temp 98.5 F (36.9 C)   Ht 5' 2.56" (1.589 m)   Wt 164 lb 9.6 oz (74.7 kg)   LMP 08/30/2022 (Approximate)   SpO2 100%   BMI 29.57 kg/m  Body mass index is 29.57 kg/m. General Appearance:  Alert, cooperative, no distress, appears stated age  Head:  Normocephalic, without obvious abnormality, atraumatic  Eyes:  Conjunctiva clear, EOM's intact  Nose: Nares normal, normal mucosa, no visible anterior polyps, and septum midline  Throat: Lips, tongue normal; teeth and gums normal, normal posterior oropharynx  Neck: Supple, symmetrical  Lungs:   clear to auscultation bilaterally, Respirations  unlabored, no coughing  Heart:  regular rate and rhythm and no murmur, Appears well perfused  Extremities: No edema  Skin: Skin color, texture, turgor normal, no rashes or lesions on visualized portions of skin  Neurologic: No gross deficits   Previous notes and tests were reviewed. The plan was reviewed with the patient/family, and all questions/concerned were addressed.  It was my pleasure to see Crystal Rhodes today and participate in her care. Please feel free to contact me with any questions or concerns.  Sincerely,  Ferol Luz, MD  Allergy & Immunology  [facility] Office: [phone]

## 2022-11-24 ENCOUNTER — Other Ambulatory Visit (HOSPITAL_COMMUNITY)
Admission: RE | Admit: 2022-11-24 | Discharge: 2022-11-24 | Disposition: A | Discharge status: Home / Self Care | Payer: 59 | Source: Ambulatory Visit | Attending: Certified Nurse Midwife | Admitting: Certified Nurse Midwife

## 2022-11-24 ENCOUNTER — Ambulatory Visit (INDEPENDENT_AMBULATORY_CARE_PROVIDER_SITE_OTHER): Payer: 59

## 2022-11-24 VITALS — BP 130/82

## 2022-11-24 DIAGNOSIS — N898 Other specified noninflammatory disorders of vagina: Secondary | ICD-10-CM | POA: Insufficient documentation

## 2022-11-24 MED ORDER — FLUCONAZOLE 150 MG PO TABS: 150.0000 mg | ORAL_TABLET | ORAL | 0 refills | Status: DC

## 2022-11-24 NOTE — Progress Notes (Signed)
Crystal Rhodes is here with concern of vaginal itching, worsens at night. Does not feel this is HSV outbreak. Recent history of candida glabrata in August. Pt reports completing course of miconazole vaginal cream. Self swab instructions given and specimen obtained. Diflucan sent to treat likely yeast infection. Second dose included for patient to repeat in 3 days if symptoms persist. Explained patient will be contacted with any abnormal results. If candida glabrata still present, provider will recommend retreatment.  [name], RN 11/24/2022  5:21 PM

## 2022-11-25 LAB — CERVICOVAGINAL ANCILLARY ONLY
Bacterial Vaginitis (gardnerella): NEGATIVE
Candida Glabrata: NEGATIVE
Candida Vaginitis: POSITIVE — AB
Chlamydia: NEGATIVE
Comment: NEGATIVE
Comment: NEGATIVE
Comment: NEGATIVE
Comment: NEGATIVE
Comment: NEGATIVE
Comment: NORMAL
Neisseria Gonorrhea: NEGATIVE
Trichomonas: NEGATIVE

## 2022-12-01 DIAGNOSIS — N898 Other specified noninflammatory disorders of vagina: Secondary | ICD-10-CM | POA: Diagnosis not present

## 2022-12-01 LAB — POCT HEMOGLOBIN-HEMACUE: Hemoglobin: 7.7 g/dL — ABNORMAL LOW (ref 12.0–15.0)

## 2022-12-03 ENCOUNTER — Ambulatory Visit (INDEPENDENT_AMBULATORY_CARE_PROVIDER_SITE_OTHER): Payer: 59 | Admitting: Certified Nurse Midwife

## 2022-12-03 VITALS — BP 134/74 | HR 79

## 2022-12-03 DIAGNOSIS — D509 Iron deficiency anemia, unspecified: Secondary | ICD-10-CM

## 2022-12-03 DIAGNOSIS — N920 Excessive and frequent menstruation with regular cycle: Secondary | ICD-10-CM

## 2022-12-03 NOTE — Progress Notes (Signed)
History:  Ms. Leonard Schwartz Keirstan Matsuyama is a 22 y.o. G1P0010 who presents to clinic today for anemia and menorrhagia. On a random finger stick to test the hemocue machine at work, her hemoglobin was noted to be 7.7. Endorses feeling fatigued and a little depressed but thought both were normal since she is working full time and in school. Denies dizziness or SOB on exertion.  The following portions of the patient's history were reviewed and updated as appropriate: allergies, current medications, family history, past medical history, social history, past surgical history and problem list.  Review of Systems:  Pertinent items noted in HPI and remainder of comprehensive ROS otherwise negative.   Objective:  Physical Exam BP 134/74   Pulse 79   SpO2 100%  Physical Exam Vitals reviewed.  Constitutional:      Appearance: Normal appearance. She is normal weight.  Cardiovascular:     Rate and Rhythm: Normal rate and regular rhythm.  Pulmonary:     Effort: Pulmonary effort is normal.  Musculoskeletal:        General: Normal range of motion.  Skin:    General: Skin is warm and dry.     Capillary Refill: Capillary refill takes less than 2 seconds.  Neurological:     Mental Status: She is alert and oriented to person, place, and time.  Psychiatric:        Mood and Affect: Mood normal.        Behavior: Behavior normal.    Labs and Imaging No results found for this or any previous visit (from the past 24 hour(s)).  No results found.   Assessment & Plan:  1. Iron deficiency anemia, unspecified iron deficiency anemia type - Iron, TIBC and Ferritin Panel - Hgb Fractionation Cascade - CBC w/Diff  2. Menorrhagia with regular cycle - Period has been getting heavier with small clots. Potentially due to anemia but will assess other causes once everything results and is treated appropriately.   Follow up PRN.  Bernerd Limbo, [place] 12/03/2022 7:56 PM

## 2022-12-04 LAB — CBC WITH DIFFERENTIAL/PLATELET
Basophils Absolute: 0 10*3/uL (ref 0.0–0.2)
Basos: 0 %
EOS (ABSOLUTE): 0.1 10*3/uL (ref 0.0–0.4)
Eos: 1 %
Hematocrit: 30 % — ABNORMAL LOW (ref 34.0–46.6)
Hemoglobin: 8.4 g/dL — ABNORMAL LOW (ref 11.1–15.9)
Immature Grans (Abs): 0 10*3/uL (ref 0.0–0.1)
Immature Granulocytes: 0 %
Lymphocytes Absolute: 2.6 10*3/uL (ref 0.7–3.1)
Lymphs: 45 %
MCH: 20.5 pg — ABNORMAL LOW (ref 26.6–33.0)
MCHC: 28 g/dL — ABNORMAL LOW (ref 31.5–35.7)
MCV: 73 fL — ABNORMAL LOW (ref 79–97)
Monocytes Absolute: 0.4 10*3/uL (ref 0.1–0.9)
Monocytes: 7 %
Neutrophils Absolute: 2.6 10*3/uL (ref 1.4–7.0)
Neutrophils: 47 %
Platelets: 269 10*3/uL (ref 150–450)
RBC: 4.09 x10E6/uL (ref 3.77–5.28)
RDW: 18.8 % — ABNORMAL HIGH (ref 11.7–15.4)
WBC: 5.7 10*3/uL (ref 3.4–10.8)

## 2022-12-05 LAB — HGB FRACTIONATION CASCADE
Hgb A2: 2.1 % (ref 1.8–3.2)
Hgb A: 97.9 % (ref 96.4–98.8)
Hgb F: 0 % (ref 0.0–2.0)
Hgb S: 0 %

## 2022-12-08 NOTE — Addendum Note (Signed)
Addended by: Maxwell Marion E on: 12/08/2022 01:57 PM   Modules accepted: Orders

## 2022-12-10 LAB — IRON,TIBC AND FERRITIN PANEL
Ferritin: 9 ng/mL — ABNORMAL LOW (ref 15–150)
Iron Saturation: 4 % — CL (ref 15–55)
Iron: 18 ug/dL — ABNORMAL LOW (ref 27–159)
Total Iron Binding Capacity: 484 ug/dL — ABNORMAL HIGH (ref 250–450)
UIBC: 466 ug/dL — ABNORMAL HIGH (ref 131–425)

## 2022-12-12 ENCOUNTER — Telehealth: Payer: Self-pay

## 2022-12-12 ENCOUNTER — Encounter: Payer: Self-pay | Admitting: Certified Nurse Midwife

## 2022-12-12 DIAGNOSIS — D509 Iron deficiency anemia, unspecified: Secondary | ICD-10-CM | POA: Insufficient documentation

## 2022-12-12 DIAGNOSIS — Z862 Personal history of diseases of the blood and blood-forming organs and certain disorders involving the immune mechanism: Secondary | ICD-10-CM | POA: Insufficient documentation

## 2022-12-12 NOTE — Addendum Note (Signed)
Addended by: Edd Arbour on: 12/12/2022 11:56 AM   Modules accepted: Orders

## 2022-12-12 NOTE — Telephone Encounter (Signed)
Jamilla, patient will be scheduled as soon as possible.  Auth Submission: NO AUTH NEEDED Site of care: Site of care: [facility] Payer: [employer] Medication & CPT/J Code(s) submitted: Venofer (Iron Sucrose) J1756 Route of submission (phone, fax, portal):  Phone # Fax # Auth type: Buy/Bill PB Units/visits requested: 500mg  x 2 doses Reference number:  Approval from: 12/12/22 to 02/10/23

## 2022-12-25 ENCOUNTER — Ambulatory Visit: Payer: 59

## 2022-12-25 VITALS — BP 130/80 | HR 64 | Temp 98.2°F | Resp 16 | Ht 62.0 in | Wt 156.8 lb

## 2022-12-25 DIAGNOSIS — D509 Iron deficiency anemia, unspecified: Secondary | ICD-10-CM | POA: Diagnosis not present

## 2022-12-25 MED ORDER — IRON SUCROSE 500 MG IVPB - SIMPLE MED
500.0000 mg | Freq: Once | INTRAVENOUS | Status: AC
  Administered 2022-12-25: 500 mg via INTRAVENOUS

## 2022-12-25 MED ORDER — ACETAMINOPHEN 325 MG PO TABS
650.0000 mg | ORAL_TABLET | Freq: Once | ORAL | Status: AC
  Administered 2022-12-25: 650 mg via ORAL
  Filled 2022-12-25: qty 2

## 2022-12-25 MED ORDER — DIPHENHYDRAMINE HCL 25 MG PO CAPS
25.0000 mg | ORAL_CAPSULE | Freq: Once | ORAL | Status: AC
  Administered 2022-12-25: 25 mg via ORAL
  Filled 2022-12-25: qty 1

## 2022-12-25 NOTE — Patient Instructions (Signed)
Iron Sucrose Injection What is this medication? IRON SUCROSE (EYE ern SOO krose) treats low levels of iron (iron deficiency anemia) in people with kidney disease. Iron is a mineral that plays an important role in making red blood cells, which carry oxygen from your lungs to the rest of your body. This medicine may be used for other purposes; ask your health care provider or pharmacist if you have questions. COMMON BRAND NAME(S): Venofer What should I tell my care team before I take this medication? They need to know if you have any of these conditions: Anemia not caused by low iron levels Heart disease High levels of iron in the blood Kidney disease Liver disease An unusual or allergic reaction to iron, other medications, foods, dyes, or preservatives Pregnant or trying to get pregnant Breastfeeding How should I use this medication? This medication is for infusion into a vein. It is given in a hospital or clinic setting. Talk to your care team about the use of this medication in children. While this medication may be prescribed for children as young as 2 years for selected conditions, precautions do apply. Overdosage: If you think you have taken too much of this medicine contact a poison control center or emergency room at once. NOTE: This medicine is only for you. Do not share this medicine with others. What if I miss a dose? Keep appointments for follow-up doses. It is important not to miss your dose. Call your care team if you are unable to keep an appointment. What may interact with this medication? Do not take this medication with any of the following: Deferoxamine Dimercaprol Other iron products This medication may also interact with the following: Chloramphenicol Deferasirox This list may not describe all possible interactions. Give your health care provider a list of all the medicines, herbs, non-prescription drugs, or dietary supplements you use. Also tell them if you smoke,  drink alcohol, or use illegal drugs. Some items may interact with your medicine. What should I watch for while using this medication? Visit your care team regularly. Tell your care team if your symptoms do not start to get better or if they get worse. You may need blood work done while you are taking this medication. You may need to follow a special diet. Talk to your care team. Foods that contain iron include: whole grains/cereals, dried fruits, beans, or peas, leafy green vegetables, and organ meats (liver, kidney). What side effects may I notice from receiving this medication? Side effects that you should report to your care team as soon as possible: Allergic reactions--skin rash, itching, hives, swelling of the face, lips, tongue, or throat Low blood pressure--dizziness, feeling faint or lightheaded, blurry vision Shortness of breath Side effects that usually do not require medical attention (report to your care team if they continue or are bothersome): Flushing Headache Joint pain Muscle pain Nausea Pain, redness, or irritation at injection site This list may not describe all possible side effects. Call your doctor for medical advice about side effects. You may report side effects to FDA at 1-800-FDA-1088. Where should I keep my medication? This medication is given in a hospital or clinic. It will not be stored at home. NOTE: This sheet is a summary. It may not cover all possible information. If you have questions about this medicine, talk to your doctor, pharmacist, or health care provider.  2024 Elsevier/Gold Standard (2022-07-04 00:00:00)

## 2022-12-25 NOTE — Progress Notes (Signed)
Diagnosis: Iron Deficiency Anemia  Provider:  Chilton Greathouse MD  Procedure: IV Infusion  IV Type: Peripheral, IV Location: R Forearm  Venofer (Iron Sucrose), Dose: 500 mg  Infusion Start Time: 0959  Infusion Stop Time: 1420  Post Infusion IV Care: Observation period completed and Peripheral IV Discontinued  Discharge: Condition: Good, Destination: Home . AVS Declined  Performed by:  Adriana Mccallum, RN

## 2023-01-13 ENCOUNTER — Ambulatory Visit: Payer: 59

## 2023-01-13 ENCOUNTER — Emergency Department (HOSPITAL_COMMUNITY)
Admission: EM | Admit: 2023-01-13 | Discharge: 2023-01-13 | Disposition: A | Discharge status: Home / Self Care | Payer: 59 | Attending: Emergency Medicine | Admitting: Emergency Medicine

## 2023-01-13 ENCOUNTER — Other Ambulatory Visit: Payer: Self-pay

## 2023-01-13 VITALS — BP 135/94 | HR 102 | Temp 98.7°F | Resp 16 | Ht 62.0 in | Wt 155.2 lb

## 2023-01-13 DIAGNOSIS — T7840XA Allergy, unspecified, initial encounter: Secondary | ICD-10-CM | POA: Diagnosis present

## 2023-01-13 DIAGNOSIS — D509 Iron deficiency anemia, unspecified: Secondary | ICD-10-CM

## 2023-01-13 MED ORDER — ACETAMINOPHEN 325 MG PO TABS
650.0000 mg | ORAL_TABLET | Freq: Once | ORAL | Status: AC
  Administered 2023-01-13: 650 mg via ORAL
  Filled 2023-01-13: qty 2

## 2023-01-13 MED ORDER — FAMOTIDINE IN NACL 20-0.9 MG/50ML-% IV SOLN
20.0000 mg | Freq: Once | INTRAVENOUS | Status: AC
  Administered 2023-01-13: 20 mg via INTRAVENOUS
  Filled 2023-01-13: qty 50

## 2023-01-13 MED ORDER — METHYLPREDNISOLONE 4 MG PO TBPK: ORAL_TABLET | ORAL | 0 refills | Status: DC

## 2023-01-13 MED ORDER — METHYLPREDNISOLONE SODIUM SUCC 125 MG IJ SOLR
125.0000 mg | Freq: Once | INTRAMUSCULAR | Status: AC | PRN
  Administered 2023-01-13: 125 mg via INTRAVENOUS

## 2023-01-13 MED ORDER — DIPHENHYDRAMINE HCL 25 MG PO CAPS
25.0000 mg | ORAL_CAPSULE | Freq: Once | ORAL | Status: AC
  Administered 2023-01-13: 25 mg via ORAL
  Filled 2023-01-13: qty 1

## 2023-01-13 MED ORDER — EPINEPHRINE 0.3 MG/0.3ML IJ SOAJ
0.3000 mg | Freq: Once | INTRAMUSCULAR | Status: AC
  Administered 2023-01-13: 0.3 mg via INTRAMUSCULAR
  Filled 2023-01-13: qty 0.3

## 2023-01-13 MED ORDER — DIPHENHYDRAMINE HCL 50 MG/ML IJ SOLN
50.0000 mg | Freq: Once | INTRAMUSCULAR | Status: AC | PRN
  Administered 2023-01-13: 50 mg via INTRAVENOUS

## 2023-01-13 MED ORDER — IRON SUCROSE 500 MG IVPB - SIMPLE MED
500.0000 mg | Freq: Once | INTRAVENOUS | Status: AC
Start: 2023-01-13 — End: 2023-01-13
  Administered 2023-01-13: 500 mg via INTRAVENOUS
  Filled 2023-01-13: qty 275

## 2023-01-13 MED ORDER — FAMOTIDINE IN NACL 20-0.9 MG/50ML-% IV SOLN: 20.0000 mg | Freq: Once | INTRAVENOUS | Status: DC | PRN

## 2023-01-13 MED ORDER — EPINEPHRINE 0.3 MG/0.3ML IJ SOAJ: 0.3000 mg | Freq: Once | INTRAMUSCULAR | Status: DC | PRN

## 2023-01-13 MED ORDER — ALBUTEROL SULFATE HFA 108 (90 BASE) MCG/ACT IN AERS: 2.0000 | INHALATION_SPRAY | Freq: Once | RESPIRATORY_TRACT | Status: DC | PRN

## 2023-01-13 MED ORDER — SODIUM CHLORIDE 0.9 % IV SOLN: Freq: Once | INTRAVENOUS | Status: DC | PRN

## 2023-01-13 NOTE — Discharge Instructions (Addendum)
Contact a health care provider if: Your symptoms do not get better with medicine. Your joints are painful or swollen. You have a fever. You have pain in your abdomen. Get help right away if: Your tongue, lips, or eyelids swell. Your chest or throat feels tight. You have trouble breathing or swallowing. These symptoms may be an emergency. Use the auto-injector pen right away. Then call 911.

## 2023-01-13 NOTE — ED Notes (Signed)
Patient d.c  with homecare instructions with mom at bedside

## 2023-01-13 NOTE — Progress Notes (Signed)
Diagnosis: Iron Deficiency Anemia  Provider:  Chilton Greathouse MD  Procedure: IV Infusion  IV Type: Peripheral, IV Location: L Forearm  Venofer (Iron Sucrose), Dose: 500 mg  Infusion Start Time: 1042  Infusion Stop Time: 1510  Post Infusion IV Care: Observation period completed  Upon completion of medication patient was experiencing hives on both arms and legs.  Stated that she was having swelling and stiffness in left hand.  Underarms itching.  Also c/o burning and tingling/tickle in throat.    Emergency protocol started.    1517 Solumedrol 125mg  IV Push  1518 Benadryl 50mg  IV Push    1523 EMS called    patient's provided notified via secure chat - Edd Arbour CNM and communication was provided about patient by infusion center RN   1535 EMS arrived 1540 EMS left with patient going to [facility] hospital   Discharge: Condition: Serious, Destination: [facility]. AVS Declined  Performed by:  Di Kindle RN

## 2023-01-13 NOTE — ED Triage Notes (Signed)
Pt was getting 2nd and final iron infusion and began having hives, tickle in throat and swelling of l hand where IV for infusion was as soon as infusion completed.   Pt noted to have hives on bil. Upper extremities, swelling on left hand, hives on face noted, throat clearing noted

## 2023-01-13 NOTE — ED Provider Notes (Signed)
[place] EMERGENCY DEPARTMENT AT [facility] Provider Note   CSN: [id number] Arrival date & time: 01/13/23  1559     History  Chief Complaint  Patient presents with   Allergic Reaction    Iron infusion    Crystal Rhodes is a 22 y.o. female presents via EMS for allergic reaction.  Patient was undergoing an iron infusion today when she had onset of hives diffusely, globus sensation in the throat.  She was given IV Solu-Medrol 125 and 75 mg of Benadryl, 25 by mouth and 50 by IV prior to arrival.  She has had improvement in globus sensation, improvement in her hives over her lower extremities however she has worsening in the hives in her upper extremity especially around the site of the infusion.  She has no previous history of anaphylaxis or allergic reaction.  She denies loss of consciousness, vomiting or hypotension.   History gathered at bedside from patient and EMS.   Allergic Reaction      Home Medications Prior to Admission medications   Medication Sig Start Date End Date Taking? Authorizing Provider  fluconazole (DIFLUCAN) 150 MG tablet Take 1 tablet (150 mg total) by mouth every 3 (three) days. Take first dose immediately. May repeat in 3 days if symptoms persist. 11/24/22   Bernerd Limbo, CNM  valACYclovir (VALTREX) 500 MG tablet Take 1 tablet (500 mg total) by mouth daily. Can increase to twice a day for 5 days in the event of a recurrence 09/24/22   Bernerd Limbo, CNM      Allergies    Patient has no known allergies.    Review of Systems   Review of Systems  Physical Exam Updated Vital Signs BP (!) 128/90 (BP Location: Left Arm)   Pulse 86   Temp 98 F (36.7 C) (Oral)   Resp 18   Ht 5\' 2"  (1.575 m)   Wt 70.4 kg   SpO2 100%   BMI 28.39 kg/m  Physical Exam Vitals and nursing note reviewed.  Constitutional:      General: She is not in acute distress.    Appearance: She is well-developed. She is not diaphoretic.  HENT:     Head:  Normocephalic and atraumatic.     Right Ear: External ear normal.     Left Ear: External ear normal.     Nose: Nose normal.     Mouth/Throat:     Mouth: Mucous membranes are moist.  Eyes:     General: No scleral icterus.    Conjunctiva/sclera: Conjunctivae normal.  Cardiovascular:     Rate and Rhythm: Normal rate and regular rhythm.     Heart sounds: Normal heart sounds. No murmur heard.    No friction rub. No gallop.  Pulmonary:     Effort: Pulmonary effort is normal. No respiratory distress.     Breath sounds: Normal breath sounds.  Abdominal:     General: Bowel sounds are normal. There is no distension.     Palpations: Abdomen is soft. There is no mass.     Tenderness: There is no abdominal tenderness. There is no guarding.  Musculoskeletal:     Cervical back: Normal range of motion.  Skin:    General: Skin is warm and dry.     Findings: Rash present. Rash is urticarial.  Neurological:     Mental Status: She is alert and oriented to person, place, and time.  Psychiatric:        Behavior: Behavior normal.  ED Results / Procedures / Treatments   Labs (all labs ordered are listed, but only abnormal results are displayed) Labs Reviewed - No data to display  EKG None  Radiology No results found.  Procedures .Critical Care  Performed by: Arthor Captain, PA-C Authorized by: Arthor Captain, PA-C   Critical care provider statement:    Critical care time (minutes):  65   Critical care time was exclusive of:  Separately billable procedures and treating other patients   Critical care was necessary to treat or prevent imminent or life-threatening deterioration of the following conditions: allergic Reaction requiring Epi injection.   Critical care was time spent personally by me on the following activities:  Development of treatment plan with patient or surrogate, discussions with consultants, evaluation of patient's response to treatment, examination of patient, ordering  and review of laboratory studies, ordering and review of radiographic studies, ordering and performing treatments and interventions, pulse oximetry, re-evaluation of patient's condition, review of old charts, interpretation of cardiac output measurements and obtaining history from patient or surrogate     Medications Ordered in ED Medications - No data to display  ED Course/ Medical Decision Making/ A&P                                 Medical Decision Making Crystal Dashonna Zambelli is a 22 y.o. female who presents to ED for reaction to Iron infusion with diffuse hives and airway involvement. Sxs consistent with Type 1 hypersensitivity reaction or anaphylaxis, without shock.. DDX includes idiopathic hives, sjs,.  Patient did have mild airway sympotoms   Given IM epi and Pepcid after pre hospital solumedrol and benadryl.. Monitored for 4 hours on cardiac monitor with showed NSR.Marland Kitchen On re-evaluation, symptoms resolved feels comfortable with discharge home. At this time she does not appear to require ongoing emergent intervention or inpatient treatment. Rx for medrol given.  Do not feel she needs an epi pen since this is an avoidable trigger.  care instructions and return precautions discussed. All questions answered.   Amount and/or Complexity of Data Reviewed Labs: ordered.  Risk Prescription drug management.           Final Clinical Impression(s) / ED Diagnoses Final diagnoses:  None    Rx / DC Orders ED Discharge Orders     None         Arthor Captain, PA-C 01/17/23 1056    Charlynne Pander, MD 01/18/23 [phone]

## 2023-02-04 NOTE — Progress Notes (Unsigned)
ANNUAL EXAM Patient name: Crystal Rhodes MRN [id number]  Date of birth: 2000/03/01 Chief Complaint:   No chief complaint on file.  History of Present Illness:   [name] is a 22 y.o. G1P0010 {race:25618} female being seen today for a routine annual exam.  Current complaints: ***  No LMP recorded.   The pregnancy intention screening data noted above was reviewed. Potential methods of contraception were discussed. The patient elected to proceed with No data recorded.   Last pap ***. Results were: {Pap findings:25134}. H/O abnormal pap: {yes/yes***/no:23866} Last mammogram: ***. Results were: {normal, abnormal, n/a:23837}. Family h/o breast cancer: {yes***/no:23838} Last colonoscopy: ***. Results were: {normal, abnormal, n/a:23837}. Family h/o colorectal cancer: {yes***/no:23838}     12/25/2022    9:28 AM  Depression screen PHQ 2/9  Decreased Interest 0  Down, Depressed, Hopeless 0  PHQ - 2 Score 0         No data to display           Review of Systems:   Pertinent items are noted in HPI Denies any headaches, blurred vision, fatigue, shortness of breath, chest pain, abdominal pain, abnormal vaginal discharge/itching/odor/irritation, problems with periods, bowel movements, urination, or intercourse unless otherwise stated above. Pertinent History Reviewed:  Reviewed past medical,surgical, social and family history.  Reviewed problem list, medications and allergies. Physical Assessment:  There were no vitals filed for this visit. There is no height or weight on file to calculate BMI.        Physical Examination:   General appearance - well appearing, and in no distress  Mental status - alert, oriented to person, place, and time  Psych:  She has a normal mood and affect  Skin - warm and dry, normal color, no suspicious lesions noted  Chest - effort normal, all lung fields clear to auscultation bilaterally  Heart - normal rate and regular rhythm  Neck:  midline  trachea, no thyromegaly or nodules  Breasts - breasts appear normal, no suspicious masses, no skin or nipple changes or  axillary nodes  Abdomen - soft, nontender, nondistended, no masses or organomegaly  Pelvic - VULVA: normal appearing vulva with no masses, tenderness or lesions  VAGINA: normal appearing vagina with normal color and discharge, no lesions  CERVIX: normal appearing cervix without discharge or lesions, no CMT  Thin prep pap is {Desc; done/not:10129} *** HR HPV cotesting  UTERUS: uterus is felt to be normal size, shape, consistency and nontender   ADNEXA: No adnexal masses or tenderness noted.  Rectal - normal rectal, good sphincter tone, no masses felt. Hemoccult: ***  Extremities:  No swelling or varicosities noted  Chaperone present for exam  No results found for this or any previous visit (from the past 24 hours).  Assessment & Plan:      1. Encounter for annual routine gynecological examination (Primary) ***  Will follow up results of pap smear and manage accordingly. Mammogram scheduled Colon cancer screening is up to date***Referral made to Gastroenterology for colonoscopy***discussed Cologuard vs Colonoscopy details and patient will decide and let us know her decision. Routine preventative health maintenance measures emphasized. Please refer to After Visit Summary for other counseling recommendations.       Mammogram: {Mammo f/u:25212::"@ 24yo"}, or sooner if problems Colonoscopy: {TCS f/u:25213::"@ 24yo"}, or sooner if problems  No orders of the defined types were placed in this encounter.   Meds: No orders of the defined types were placed in this encounter.   Follow-up: No follow-ups  on file.  Bernerd Limbo, CNM 02/04/2023 8:51 PM

## 2023-02-05 ENCOUNTER — Other Ambulatory Visit (HOSPITAL_COMMUNITY)
Admission: RE | Admit: 2023-02-05 | Discharge: 2023-02-05 | Disposition: A | Discharge status: Home / Self Care | Payer: 59 | Source: Ambulatory Visit | Attending: Certified Nurse Midwife | Admitting: Certified Nurse Midwife

## 2023-02-05 ENCOUNTER — Encounter: Payer: Self-pay | Admitting: Certified Nurse Midwife

## 2023-02-05 ENCOUNTER — Ambulatory Visit: Payer: 59 | Admitting: Certified Nurse Midwife

## 2023-02-05 VITALS — BP 127/69 | HR 68 | Wt 160.3 lb

## 2023-02-05 DIAGNOSIS — Z01419 Encounter for gynecological examination (general) (routine) without abnormal findings: Secondary | ICD-10-CM | POA: Diagnosis present

## 2023-02-06 LAB — CBC
Hematocrit: 38.9 % (ref 34.0–46.6)
Hemoglobin: 11.7 g/dL (ref 11.1–15.9)
MCH: 24.3 pg — ABNORMAL LOW (ref 26.6–33.0)
MCHC: 30.1 g/dL — ABNORMAL LOW (ref 31.5–35.7)
MCV: 81 fL (ref 79–97)
Platelets: 311 10*3/uL (ref 150–450)
RBC: 4.81 x10E6/uL (ref 3.77–5.28)
RDW: 19 % — ABNORMAL HIGH (ref 11.7–15.4)
WBC: 4.7 10*3/uL (ref 3.4–10.8)

## 2023-02-06 LAB — IRON,TIBC AND FERRITIN PANEL
Ferritin: 135 ng/mL (ref 15–150)
Iron Saturation: 24 % (ref 15–55)
Iron: 77 ug/dL (ref 27–159)
Total Iron Binding Capacity: 317 ug/dL (ref 250–450)
UIBC: 240 ug/dL (ref 131–425)

## 2023-02-06 LAB — RPR+HBSAG+HCVAB+...
HIV Screen 4th Generation wRfx: NONREACTIVE
Hep C Virus Ab: NONREACTIVE
Hepatitis B Surface Ag: NEGATIVE
RPR Ser Ql: NONREACTIVE

## 2023-02-09 LAB — CYTOLOGY - PAP
Adequacy: ABSENT
Chlamydia: NEGATIVE
Comment: NEGATIVE
Comment: NEGATIVE
Comment: NORMAL
Diagnosis: NEGATIVE
Neisseria Gonorrhea: NEGATIVE
Trichomonas: NEGATIVE

## 2023-02-26 ENCOUNTER — Other Ambulatory Visit: Payer: Self-pay

## 2023-02-26 DIAGNOSIS — N898 Other specified noninflammatory disorders of vagina: Secondary | ICD-10-CM

## 2023-02-26 MED ORDER — FLUCONAZOLE 150 MG PO TABS: 150.0000 mg | ORAL_TABLET | ORAL | 0 refills | Status: DC

## 2023-02-26 NOTE — Progress Notes (Signed)
Patient will be traveling and is concerned about recurrent yeast infections especially in new environments. Would like vaginal yeast treatment sent in case of vaginal irritation while traveling. Reviewed with Edd Arbour, CNM who is okay with plan for treatment.

## 2023-04-15 ENCOUNTER — Other Ambulatory Visit: Payer: Self-pay | Admitting: Certified Nurse Midwife

## 2023-04-15 DIAGNOSIS — Z3200 Encounter for pregnancy test, result unknown: Secondary | ICD-10-CM

## 2023-04-15 LAB — BETA HCG QUANT (REF LAB): hCG Quant: 9 m[IU]/mL

## 2023-04-29 ENCOUNTER — Other Ambulatory Visit: Payer: Self-pay | Admitting: Obstetrics and Gynecology

## 2023-04-29 DIAGNOSIS — G8929 Other chronic pain: Secondary | ICD-10-CM

## 2023-07-03 ENCOUNTER — Ambulatory Visit: Admitting: Physician Assistant

## 2023-07-03 ENCOUNTER — Encounter: Payer: Self-pay | Admitting: Physician Assistant

## 2023-07-03 VITALS — BP 136/76 | HR 66 | Wt 163.0 lb

## 2023-07-03 DIAGNOSIS — R519 Headache, unspecified: Secondary | ICD-10-CM | POA: Insufficient documentation

## 2023-07-03 MED ORDER — CYCLOBENZAPRINE HCL 10 MG PO TABS: 10.0000 mg | ORAL_TABLET | Freq: Three times a day (TID) | ORAL | 2 refills | Status: DC | PRN

## 2023-07-03 MED ORDER — IMIPRAMINE HCL 10 MG PO TABS: 30.0000 mg | ORAL_TABLET | Freq: Every day | ORAL | 3 refills | Status: DC

## 2023-07-03 MED ORDER — BUTALBITAL-APAP-CAFFEINE 50-325-40 MG PO CAPS
1.0000 | ORAL_CAPSULE | Freq: Four times a day (QID) | ORAL | 2 refills | Status: DC | PRN
Start: 2023-07-03 — End: 2023-08-13

## 2023-07-03 NOTE — Progress Notes (Signed)
 New patient here to discuss HA management.  HA's since early Feb. Takes Tylenol  and IBP.   History:  Crystal Rhodes is a 23 y.o. G1P0010 who presents to clinic today for eval of new headache with onset in February.   Unrelated to vision or blood pressure No sensitivity to HA Pain is located bilaterally across the forehead, temple and frontal portion of the head. She may have multiple attacks per day occurring several days per week.   It lasts to 2 hours, sometimes throbbing.  No worsening with movement.  No sensitivity to light/noise/smell.  She has random bouts of nausea but is uncertain if it's related to headache.  It is always during the day/evening regardless of the day of the week.  She never wakes with them during the night or in the morning.  She sometimes gets ringing in right ear while she has the headache, also lasting a few minutes to 30 minutes.  She may have a very short lived numbness in the right arm. She doesn't exercise regularly.  She generally has a cup of coffee per day during weekdays only.  She is not using medication including contraception (uses condoms) other than the ibuprofen  or tylenol  that she uses for these headaches.  She denies vaping/smoking/recreational drug use. She works as a CMA daytime hours in ob/gyn clinic.  She lives with boyfriend and doesn't have other obligations.  She is unaware of any changes around the time of the headaches first beginning in February.     Past Medical History:  Diagnosis Date   Anemia    Angio-edema    Eczema    STD (sexually transmitted disease)    chlamydia treated   Urticaria     Social History   Socioeconomic History   Marital status: Single    Spouse name: Not on file   Number of children: Not on file   Years of education: Not on file   Highest education level: Not on file  Occupational History   Not on file  Tobacco Use   Smoking status: Never    Passive exposure: Past   Smokeless tobacco: Never   Vaping Use   Vaping status: Some Days  Substance and Sexual Activity   Alcohol use: Yes    Comment: occ   Drug use: No   Sexual activity: Yes    Partners: Male    Birth control/protection: None  Other Topics Concern   Not on file  Social History Narrative   Not on file   Social Drivers of Health   Financial Resource Strain: Not on file  Food Insecurity: Not on file  Transportation Needs: Not on file  Physical Activity: Not on file  Stress: Not on file  Social Connections: Not on file  Intimate Partner Violence: Not on file    Family History  Problem Relation Age of Onset   Hypertension Mother    Thyroid  disease Mother    Diabetes Father    Asthma Sister    Asthma Sister    Cancer Paternal Aunt     Allergies  Allergen Reactions   Iron  Anaphylaxis    IV Iron  infusion   Venofer  [Iron  Sucrose] Hives, Swelling and Other (See Comments)    Stiffness and discoloration in left hand.    Current Outpatient Medications on File Prior to Visit  Medication Sig Dispense Refill   fluconazole  (DIFLUCAN ) 150 MG tablet Take 1 tablet (150 mg total) by mouth every 3 (three) days. May repeat dose  if no improvement in symptoms within 3 days. 2 tablet 0   valACYclovir  (VALTREX ) 500 MG tablet Take 1 tablet (500 mg total) by mouth daily. Can increase to twice a day for 5 days in the event of a recurrence (Patient not taking: Reported on 02/05/2023) 30 tablet 12   No current facility-administered medications on file prior to visit.     Review of Systems:  All pertinent positive/negative included in HPI, all other review of systems are negative   Objective:  Physical Exam BP 136/76   Pulse 66   Wt 163 lb (73.9 kg)   BMI 29.81 kg/m  CONSTITUTIONAL: Well-developed, well-nourished female in no acute distress.  EYES: EOM intact ENT: Normocephalic CARDIOVASCULAR: Regular rate  RESPIRATORY: Normal rate.  MUSCULOSKELETAL: Normal ROM SKIN: Warm, dry without erythema  NEUROLOGICAL:  Alert, oriented, CN II-XII grossly intact, Appropriate balance PSYCH: Normal behavior, mood   Assessment & Plan:  Assessment: 1. Nonintractable episodic headache, unspecified headache type    Pt symptomology does not fit well into traditional headache types.  Concerning symptoms of ear ringing and arm numbness.    Plan: Ambulatory referral to Dr. Henderson Lock for further eval.   Imipramine 10mg .  Use 1 before bed each evening for a week.  If well tolerated, may titrate weekly to a max dose of 30mg .  Pt advised of possible drying side effects and possible sedation.  She should not increase dosing if side effects are significant. Cyclobenzaprine for acute headache.  Use for the first time in evening and note how sedating it is before trying during the day.   Fioricet for acute HA if cyclobenzaprine not helpful or too sedating.  Do not use with caffeine or other acetaminophen  product.  Limit use to 2 days/week. Follow-up PRN.  In the event of severe symptoms, go to ED.  50 minutes spent in direct patient care with patient this encounter.  Crystal Miles, PA-C 07/03/2023 11:08 AM

## 2023-08-12 ENCOUNTER — Ambulatory Visit (INDEPENDENT_AMBULATORY_CARE_PROVIDER_SITE_OTHER)

## 2023-08-12 VITALS — BP 138/81 | HR 79

## 2023-08-12 DIAGNOSIS — O3680X0 Pregnancy with inconclusive fetal viability, not applicable or unspecified: Secondary | ICD-10-CM

## 2023-08-12 DIAGNOSIS — Z3201 Encounter for pregnancy test, result positive: Secondary | ICD-10-CM

## 2023-08-12 DIAGNOSIS — Z3200 Encounter for pregnancy test, result unknown: Secondary | ICD-10-CM

## 2023-08-12 LAB — POCT PREGNANCY, URINE: Preg Test, Ur: POSITIVE — AB

## 2023-08-12 NOTE — Progress Notes (Signed)
 Possible Pregnancy  Here today for pregnancy confirmation. UPT in office today is positive. Reviewed dating with patient:   LMP: 07/12/23 EDD: 04/17/24 4w 3d today  OB history reviewed. Will schedule US  around 7 weeks for viability. Reviewed medications and allergies with patient. Continue taking Blood Builder due to history of iron  deficiency anemia. Will also add vitamin D supplement. Reviewed options for nausea if needed. Encouraged hydration and frequent snacks/meals. New OB appt to be scheduled around 10 weeks with Cornell Finder, CNM.  Vernell FORBES Ruddle, RN 08/12/2023  5:09 PM

## 2023-08-13 ENCOUNTER — Other Ambulatory Visit: Payer: Self-pay | Admitting: Certified Nurse Midwife

## 2023-08-13 MED ORDER — DOXYLAMINE-PYRIDOXINE 10-10 MG PO TBEC: 1.0000 | DELAYED_RELEASE_TABLET | Freq: Every day | ORAL | 6 refills | Status: DC

## 2023-08-19 ENCOUNTER — Ambulatory Visit: Admitting: Certified Nurse Midwife

## 2023-08-19 ENCOUNTER — Encounter: Payer: Self-pay | Admitting: Certified Nurse Midwife

## 2023-08-19 VITALS — BP 136/84 | HR 84

## 2023-08-19 DIAGNOSIS — J4520 Mild intermittent asthma, uncomplicated: Secondary | ICD-10-CM | POA: Diagnosis not present

## 2023-08-19 DIAGNOSIS — R0602 Shortness of breath: Secondary | ICD-10-CM

## 2023-08-19 LAB — POCT HEMOGLOBIN-HEMACUE: Hemoglobin: 11.6 g/dL — ABNORMAL LOW (ref 12.0–15.0)

## 2023-08-19 MED ORDER — ALBUTEROL SULFATE HFA 108 (90 BASE) MCG/ACT IN AERS: 2.0000 | INHALATION_SPRAY | Freq: Four times a day (QID) | RESPIRATORY_TRACT | 2 refills | Status: DC | PRN

## 2023-08-20 DIAGNOSIS — J45909 Unspecified asthma, uncomplicated: Secondary | ICD-10-CM | POA: Insufficient documentation

## 2023-08-20 NOTE — Progress Notes (Signed)
 History:  Ms. Crystal Rhodes is a 23 y.o. G1P0010 who presents to clinic today for shortness of breath. Lately has been feeling like she cannot catch her breath, often without exertion. No history of asthma - has been severely anemic before but was treated with two IV iron  infusions. No other precipitating events or accompanying symptoms.   The following portions of the patient's history were reviewed and updated as appropriate: allergies, current medications, family history, past medical history, social history, past surgical history and problem list.  Review of Systems:  Pertinent items noted in HPI and remainder of comprehensive ROS otherwise negative.   Objective:  Physical Exam BP 136/84   Pulse 84   LMP 07/12/2023 (Exact Date)   SpO2 100%   Breastfeeding Unknown  Physical Exam Vitals reviewed.  Constitutional:      General: She is not in acute distress.    Appearance: Normal appearance. She is normal weight. She is not ill-appearing.  Eyes:     Pupils: Pupils are equal, round, and reactive to light.  Cardiovascular:     Rate and Rhythm: Normal rate and regular rhythm.     Pulses: Normal pulses.  Pulmonary:     Effort: Pulmonary effort is normal. No respiratory distress.     Breath sounds: Normal breath sounds. No stridor. No wheezing, rhonchi or rales.  Chest:     Chest wall: No tenderness.  Abdominal:     Palpations: Abdomen is soft.     Tenderness: There is no abdominal tenderness.  Musculoskeletal:        General: Normal range of motion.     Cervical back: Normal range of motion.  Skin:    General: Skin is warm and dry.     Capillary Refill: Capillary refill takes less than 2 seconds.  Neurological:     Mental Status: She is alert and oriented to person, place, and time.  Psychiatric:        Mood and Affect: Mood normal.        Behavior: Behavior normal.    Labs and Imaging No results found for this or any previous visit (from the past 24 hours).  No results  found.   Assessment & Plan:  1. Shortness of breath (Primary) - POCT Hgb 11.6. - Sent pt to pharmacy to pick up albuterol  inhaler and coached her through initial administration. SOB resolved immediately. Likely new onset reactive airway with new onset environmental allergies. If issue persists, will send to Dr. Sheena for evaluation (since lung sounds are clear). - albuterol  (VENTOLIN  HFA) 108 (90 Base) MCG/ACT inhaler; Inhale 2 puffs into the lungs every 6 (six) hours as needed for wheezing or shortness of breath.  Dispense: 8 g; Refill: 2   Vannie Cornell SAUNDERS, CNM 08/20/2023 1:30 PM

## 2023-08-29 ENCOUNTER — Other Ambulatory Visit: Payer: Self-pay

## 2023-08-29 MED ORDER — ONDANSETRON HCL 4 MG PO TABS: 4.0000 mg | ORAL_TABLET | Freq: Three times a day (TID) | ORAL | 0 refills | Status: DC | PRN

## 2023-09-01 ENCOUNTER — Other Ambulatory Visit (INDEPENDENT_AMBULATORY_CARE_PROVIDER_SITE_OTHER): Payer: Self-pay

## 2023-09-01 ENCOUNTER — Ambulatory Visit (INDEPENDENT_AMBULATORY_CARE_PROVIDER_SITE_OTHER): Admitting: *Deleted

## 2023-09-01 ENCOUNTER — Ambulatory Visit (HOSPITAL_COMMUNITY)

## 2023-09-01 VITALS — BP 134/82 | HR 69

## 2023-09-01 DIAGNOSIS — Z3A01 Less than 8 weeks gestation of pregnancy: Secondary | ICD-10-CM

## 2023-09-01 DIAGNOSIS — Z3481 Encounter for supervision of other normal pregnancy, first trimester: Secondary | ICD-10-CM

## 2023-09-01 DIAGNOSIS — Z3400 Encounter for supervision of normal first pregnancy, unspecified trimester: Secondary | ICD-10-CM | POA: Insufficient documentation

## 2023-09-01 DIAGNOSIS — O3680X0 Pregnancy with inconclusive fetal viability, not applicable or unspecified: Secondary | ICD-10-CM

## 2023-09-01 DIAGNOSIS — Z3490 Encounter for supervision of normal pregnancy, unspecified, unspecified trimester: Secondary | ICD-10-CM | POA: Insufficient documentation

## 2023-09-01 NOTE — Progress Notes (Signed)
 Dating Scan  I explained I am completing a dating/viability scan today. Pt receives care at [facility]. We discussed EDD of 04/17/2024, by Last Menstrual Period. Pt is G2P0010. I reviewed her allergies, medications and Medical/Surgical/OB history.    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  Patient also informed that the ultrasound is not being completed with the intent of assessing for fetal or placental anomalies or any pelvic abnormalities. Explained that the purpose of today's ultrasound is to assess for viability.  Patient acknowledges the purpose of the exam and the limitations of the study.     Wanda Buckles, RN

## 2023-09-03 ENCOUNTER — Telehealth: Payer: Self-pay | Admitting: Neurology

## 2023-09-03 NOTE — Telephone Encounter (Signed)
 MYC CONF

## 2023-09-07 ENCOUNTER — Encounter: Payer: Self-pay | Admitting: Neurology

## 2023-09-07 ENCOUNTER — Ambulatory Visit (INDEPENDENT_AMBULATORY_CARE_PROVIDER_SITE_OTHER): Admitting: Neurology

## 2023-09-07 VITALS — BP 143/84 | HR 74 | Resp 15 | Ht 62.0 in | Wt 157.0 lb

## 2023-09-07 DIAGNOSIS — R519 Headache, unspecified: Secondary | ICD-10-CM | POA: Diagnosis not present

## 2023-09-07 NOTE — Progress Notes (Signed)
 Chief Complaint  Patient presents with   New Patient (Initial Visit)    Rm14, alone,  internal referral for headaches: pt stated that her ha's have decreased drastically but previously was having 2 a day. Triggers: unidentifiable       ASSESSMENT AND PLAN  Crystal Crystal Rhodes is a 23 y.o. female   Intermittent headaches,  Improved, currently [redacted] weeks pregnant  Normal neurological examination, including normal bilateral funduscopy examination  May have underlying migraine, her recent orthodontic work might contributed to  As needed Tylenol   Only return to clinic for new issues  DIAGNOSTIC DATA (LABS, IMAGING, TESTING) - I reviewed patient records, labs, notes, testing and imaging myself where available.   MEDICAL HISTORY:  Crystal Rhodes, is a 23 year old female, seen in request by her primary care from [facility] Dr. Gerome, Lon[place], for evaluation of frequent headache, initial evaluation was on September 07, 2023  History is obtained from the patient and review of electronic medical records. I personally reviewed pertinent available imaging films in PACS.   PMHx of  Anemia Strabismus Breast reduction surgery in Feb 2024,   She is currently [redacted] weeks pregnant, no longer has frequent headaches, from fall of 2024 until March 2025, she has frequent headache, described bifrontal retro-orbital area pressure headaches, can lasting for few minutes or longer, denies significant light noise sensitivity or nausea, no visual change,  This correlate with her orthodontic work, which started since September 2025  She no longer has headaches, currently [redacted] weeks pregnant, doing well, optometry evaluation  in January 2025 was normal    PHYSICAL EXAM:   Vitals:   09/07/23 0823 09/07/23 0828  BP: 121/79 (!) 143/84  Pulse:  74  Resp:  15  Weight:  157 lb (71.2 kg)  Height:  5' 2 (1.575 m)   Body mass index is 28.72 kg/m.  PHYSICAL EXAMNIATION:  Gen: NAD, conversant, well  nourised, well groomed                     Cardiovascular: Regular rate rhythm, no peripheral edema, warm, nontender. Eyes: Conjunctivae clear without exudates or hemorrhage Neck: Supple, no carotid bruits. Pulmonary: Clear to auscultation bilaterally   NEUROLOGICAL EXAM:  MENTAL STATUS: Speech/cognition: Awake, alert, oriented to history taking and casual conversation CRANIAL NERVES: CN II: Visual fields are full to confrontation. Pupils are round equal and briskly reactive to light.  Funduscopy examination were normal bilaterally CN III, IV, VI: extraocular movement are normal. No ptosis. CN V: Facial sensation is intact to light touch CN VII: Face is symmetric with normal eye closure  CN VIII: Hearing is normal to causal conversation. CN IX, X: Phonation is normal. CN XI: Head turning and shoulder shrug are intact  MOTOR: There is no pronator drift of out-stretched arms. Muscle bulk and tone are normal. Muscle strength is normal.  REFLEXES: Reflexes are 2+ and symmetric at the biceps, triceps, knees, and ankles. Plantar responses are flexor.  SENSORY: Intact to light touch, pinprick and vibratory sensation are intact in fingers and toes.  COORDINATION: There is no trunk or limb dysmetria noted.  GAIT/STANCE: Posture is normal. Gait is steady    REVIEW OF SYSTEMS:  Full 14 system review of systems performed and notable only for as above All other review of systems were negative.   ALLERGIES: Allergies  Allergen Reactions   Iron  Anaphylaxis    IV Iron  infusion   Venofer  [Iron  Sucrose] Hives, Swelling and Other (See Comments)  Stiffness and discoloration in left hand.    HOME MEDICATIONS: Current Outpatient Medications  Medication Sig Dispense Refill   albuterol  (VENTOLIN  HFA) 108 (90 Base) MCG/ACT inhaler Inhale 2 puffs into the lungs every 6 (six) hours as needed for wheezing or shortness of breath. 8 g 2   ondansetron  (ZOFRAN ) 4 MG tablet Take 1 tablet (4 mg  total) by mouth every 8 (eight) hours as needed for nausea or vomiting. 20 tablet 0   valACYclovir  (VALTREX ) 500 MG tablet Take 1 tablet (500 mg total) by mouth daily. Can increase to twice a day for 5 days in the event of a recurrence 30 tablet 12   No current facility-administered medications for this visit.    PAST MEDICAL HISTORY: Past Medical History:  Diagnosis Date   Anemia    Angio-edema    Eczema    STD (sexually transmitted disease)    chlamydia treated   Urticaria     PAST SURGICAL HISTORY: Past Surgical History:  Procedure Laterality Date   BREAST REDUCTION SURGERY  03/2022   EYE SURGERY     EYE SURGERY  2006    FAMILY HISTORY: Family History  Problem Relation Age of Onset   Hypertension Mother    Thyroid  disease Mother    Diabetes Father    Asthma Sister    Asthma Sister    Cancer Paternal Aunt     SOCIAL HISTORY: Social History   Socioeconomic History   Marital status: Single    Spouse name: Not on file   Number of children: 0   Years of education: Not on file   Highest education level: Associate degree: academic program  Occupational History   Not on file  Tobacco Use   Smoking status: Never    Passive exposure: Past   Smokeless tobacco: Never  Vaping Use   Vaping status: Former  Substance and Sexual Activity   Alcohol use: Yes    Comment: occ   Drug use: No   Sexual activity: Yes    Partners: Male    Birth control/protection: None  Other Topics Concern   Not on file  Social History Narrative   Not on file   Social Drivers of Health   Financial Resource Strain: Not on file  Food Insecurity: Not on file  Transportation Needs: Not on file  Physical Activity: Not on file  Stress: Not on file  Social Connections: Not on file  Intimate Partner Violence: Not on file      Modena Callander, M.D. Ph.D.  [facility] [address] [place], [place] [address] Ph: [phone] Fax: [phone]  CC:  Ajewole,  Christana, MD [address] [place],  [place] [address]  Collins, Dana, DO

## 2023-09-09 ENCOUNTER — Other Ambulatory Visit (HOSPITAL_COMMUNITY)
Admission: RE | Admit: 2023-09-09 | Discharge: 2023-09-09 | Disposition: A | Discharge status: Home / Self Care | Source: Ambulatory Visit | Attending: Certified Nurse Midwife | Admitting: Certified Nurse Midwife

## 2023-09-09 ENCOUNTER — Other Ambulatory Visit: Payer: Self-pay

## 2023-09-09 ENCOUNTER — Other Ambulatory Visit: Payer: Self-pay | Admitting: Family Medicine

## 2023-09-09 DIAGNOSIS — N898 Other specified noninflammatory disorders of vagina: Secondary | ICD-10-CM

## 2023-09-09 DIAGNOSIS — O219 Vomiting of pregnancy, unspecified: Secondary | ICD-10-CM

## 2023-09-09 MED ORDER — SCOPOLAMINE 1 MG/3DAYS TD PT72: 1.0000 | MEDICATED_PATCH | TRANSDERMAL | 3 refills | Status: DC

## 2023-09-09 MED ORDER — TERCONAZOLE 0.4 % VA CREA: 1.0000 | TOPICAL_CREAM | Freq: Every day | VAGINAL | 0 refills | Status: DC

## 2023-09-09 NOTE — Progress Notes (Signed)
 Patient is currently [redacted]w[redacted]d and having severe nausea and vomiting. Current regimen in not working well. Discussed use of scopolamine  patch. Accepts rx.

## 2023-09-09 NOTE — Progress Notes (Signed)
 Reports vaginal itching and mild, intermittent lower abdominal pain. No vaginal bleeding. UA is not concerning for UTI. Vaginal swab collected. Will treat preemptively with Terazol cream and follow results.   Vernell RN 09/09/23

## 2023-09-10 LAB — POCT URINALYSIS DIP (DEVICE)
Bilirubin Urine: NEGATIVE
Glucose, UA: NEGATIVE mg/dL
Hgb urine dipstick: NEGATIVE
Ketones, ur: NEGATIVE mg/dL
Leukocytes,Ua: NEGATIVE
Nitrite: NEGATIVE
Protein, ur: NEGATIVE mg/dL
Specific Gravity, Urine: 1.025 (ref 1.005–1.030)
Urobilinogen, UA: 1 mg/dL (ref 0.0–1.0)
pH: 7 (ref 5.0–8.0)

## 2023-09-11 LAB — CERVICOVAGINAL ANCILLARY ONLY
Bacterial Vaginitis (gardnerella): NEGATIVE
Candida Glabrata: NEGATIVE
Candida Vaginitis: POSITIVE — AB
Chlamydia: NEGATIVE
Comment: NEGATIVE
Comment: NEGATIVE
Comment: NEGATIVE
Comment: NEGATIVE
Comment: NEGATIVE
Comment: NORMAL
Neisseria Gonorrhea: NEGATIVE
Trichomonas: NEGATIVE

## 2023-09-24 LAB — CBC/D/PLT+RPR+RH+ABO+RUBIGG...
Antibody Screen: NEGATIVE
Basophils Absolute: 0 x10E3/uL (ref 0.0–0.2)
Basos: 0 %
EOS (ABSOLUTE): 0 x10E3/uL (ref 0.0–0.4)
Eos: 0 %
HCV Ab: NONREACTIVE
HIV Screen 4th Generation wRfx: NONREACTIVE
Hematocrit: 36.7 % (ref 34.0–46.6)
Hemoglobin: 11.5 g/dL (ref 11.1–15.9)
Hepatitis B Surface Ag: NEGATIVE
Immature Grans (Abs): 0 x10E3/uL (ref 0.0–0.1)
Immature Granulocytes: 0 %
Lymphocytes Absolute: 1.6 x10E3/uL (ref 0.7–3.1)
Lymphs: 24 %
MCH: 27.8 pg (ref 26.6–33.0)
MCHC: 31.3 g/dL — ABNORMAL LOW (ref 31.5–35.7)
MCV: 89 fL (ref 79–97)
Monocytes Absolute: 0.4 x10E3/uL (ref 0.1–0.9)
Monocytes: 7 %
Neutrophils Absolute: 4.4 x10E3/uL (ref 1.4–7.0)
Neutrophils: 69 %
Platelets: 258 x10E3/uL (ref 150–450)
RBC: 4.13 x10E6/uL (ref 3.77–5.28)
RDW: 12.8 % (ref 11.7–15.4)
RPR Ser Ql: REACTIVE — AB
Rh Factor: POSITIVE
Rubella Antibodies, IGG: <0.9 {index} — ABNORMAL LOW (ref >0.99)
WBC: 6.4 x10E3/uL (ref 3.4–10.8)

## 2023-09-24 LAB — ANEMIA PROFILE B
Ferritin: 154 ng/mL — ABNORMAL HIGH (ref 15–150)
Folate: 12.1 ng/mL (ref >3.0)
Iron Saturation: 30 % (ref 15–55)
Iron: 109 ug/dL (ref 27–159)
Retic Ct Pct: 3.1 % — ABNORMAL HIGH (ref 0.6–2.6)
Total Iron Binding Capacity: 358 ug/dL (ref 250–450)
UIBC: 249 ug/dL (ref 131–425)
Vitamin B-12: 509 pg/mL (ref 232–1245)

## 2023-09-24 LAB — HCV INTERPRETATION

## 2023-09-24 LAB — VITAMIN D 25 HYDROXY (VIT D DEFICIENCY, FRACTURES): Vit D, 25-Hydroxy: 13 ng/mL — ABNORMAL LOW (ref 30.0–100.0)

## 2023-09-24 LAB — RPR, QUANT+TP ABS (REFLEX)
Rapid Plasma Reagin, Quant: 1:1 {titer} — ABNORMAL HIGH
T Pallidum Abs: NONREACTIVE

## 2023-09-24 LAB — HEMOGLOBIN A1C
Est. average glucose Bld gHb Est-mCnc: 97 mg/dL
Hgb A1c MFr Bld: 5 % (ref 4.8–5.6)

## 2023-09-25 ENCOUNTER — Ambulatory Visit: Payer: Self-pay | Admitting: Certified Nurse Midwife

## 2023-09-25 ENCOUNTER — Encounter: Payer: Self-pay | Admitting: Certified Nurse Midwife

## 2023-09-25 DIAGNOSIS — Z2839 Other underimmunization status: Secondary | ICD-10-CM | POA: Insufficient documentation

## 2023-09-25 DIAGNOSIS — E559 Vitamin D deficiency, unspecified: Secondary | ICD-10-CM

## 2023-09-25 LAB — URINE CULTURE, OB REFLEX

## 2023-09-25 LAB — CULTURE, OB URINE

## 2023-09-25 MED ORDER — VITAMIN D (ERGOCALCIFEROL) 1.25 MG (50000 UNIT) PO CAPS: 50000.0000 [IU] | ORAL_CAPSULE | ORAL | 0 refills | Status: AC

## 2023-09-28 LAB — PANORAMA PRENATAL TEST FULL PANEL:PANORAMA TEST PLUS 5 ADDITIONAL MICRODELETIONS: FETAL FRACTION: 8.2

## 2023-09-30 ENCOUNTER — Ambulatory Visit (INDEPENDENT_AMBULATORY_CARE_PROVIDER_SITE_OTHER): Admitting: Certified Nurse Midwife

## 2023-09-30 ENCOUNTER — Encounter: Payer: Self-pay | Admitting: Certified Nurse Midwife

## 2023-09-30 VITALS — BP 128/86 | HR 89 | Ht 62.5 in | Wt 158.7 lb

## 2023-09-30 DIAGNOSIS — Z3490 Encounter for supervision of normal pregnancy, unspecified, unspecified trimester: Secondary | ICD-10-CM

## 2023-09-30 DIAGNOSIS — B009 Herpesviral infection, unspecified: Secondary | ICD-10-CM

## 2023-09-30 DIAGNOSIS — O219 Vomiting of pregnancy, unspecified: Secondary | ICD-10-CM | POA: Diagnosis not present

## 2023-09-30 DIAGNOSIS — Z1332 Encounter for screening for maternal depression: Secondary | ICD-10-CM | POA: Diagnosis not present

## 2023-09-30 DIAGNOSIS — Z862 Personal history of diseases of the blood and blood-forming organs and certain disorders involving the immune mechanism: Secondary | ICD-10-CM | POA: Diagnosis not present

## 2023-09-30 DIAGNOSIS — Z3A11 11 weeks gestation of pregnancy: Secondary | ICD-10-CM | POA: Diagnosis not present

## 2023-09-30 DIAGNOSIS — Z3491 Encounter for supervision of normal pregnancy, unspecified, first trimester: Secondary | ICD-10-CM

## 2023-09-30 NOTE — Progress Notes (Signed)
 History:   Crystal Rhodes is a 23 y.o. G2P0010 at [redacted]w[redacted]d by LMP, early ultrasound being seen today for her first obstetrical visit.  Her obstetrical history is significant for one early miscarriage, HSV but no other pregnancies. Was anemic but that is now resolved. Patient does intend to breast feed, would like to have an unmedicated waterbirth, and desires continuous midwifery care. Pregnancy history fully reviewed.  Patient reports no complaints. Starting to feel better, was more nauseated.   HISTORY: OB History  Gravida Para Term Preterm AB Living  2 0 0 0 1 0  SAB IAB Ectopic Multiple Live Births  0 1 0 0 0    # Outcome Date GA Lbr Len/2nd Weight Sex Type Anes PTL Lv  2 Current           1 IAB 02/14/22            Last pap smear was done 12/26/2 and was normal  Past Medical History:  Diagnosis Date   Anemia    Angio-edema    Eczema    STD (sexually transmitted disease)    chlamydia treated   Urticaria    Past Surgical History:  Procedure Laterality Date   BREAST REDUCTION SURGERY  03/2022   EYE SURGERY Bilateral 2006   Family History  Problem Relation Age of Onset   Hypertension Mother    Hypothyroidism Mother    Diabetes type II Father    Asthma Sister    Asthma Sister    Cancer Paternal Aunt    Heart Problems Paternal Aunt    Social History   Tobacco Use   Smoking status: Never    Passive exposure: Past   Smokeless tobacco: Never  Vaping Use   Vaping status: Former  Substance Use Topics   Alcohol use: Not Currently    Comment: occ   Drug use: No   Allergies  Allergen Reactions   Iron  Anaphylaxis    IV Iron  infusion   Venofer  [Iron  Sucrose] Hives, Swelling and Other (See Comments)    Stiffness and discoloration in left hand.   Current Outpatient Medications on File Prior to Visit  Medication Sig Dispense Refill   Prenatal Vit-Fe Fumarate-FA (PRENATAL PO) Take by mouth daily.     Vitamin D , Ergocalciferol , (DRISDOL ) 1.25 MG (50000 UNIT) CAPS  capsule Take 1 capsule (50,000 Units total) by mouth every 7 (seven) days for 12 doses. 12 capsule 0   albuterol  (VENTOLIN  HFA) 108 (90 Base) MCG/ACT inhaler Inhale 2 puffs into the lungs every 6 (six) hours as needed for wheezing or shortness of breath. (Patient not taking: Reported on 09/30/2023) 8 g 2   ondansetron  (ZOFRAN ) 4 MG tablet Take 1 tablet (4 mg total) by mouth every 8 (eight) hours as needed for nausea or vomiting. (Patient not taking: Reported on 09/30/2023) 20 tablet 0   scopolamine  (TRANSDERM-SCOP) 1 MG/3DAYS Place 1 patch (1.5 mg total) onto the skin every 3 (three) days. (Patient not taking: Reported on 09/30/2023) 10 patch 3   valACYclovir  (VALTREX ) 500 MG tablet Take 1 tablet (500 mg total) by mouth daily. Can increase to twice a day for 5 days in the event of a recurrence (Patient not taking: Reported on 09/30/2023) 30 tablet 12   No current facility-administered medications on file prior to visit.   Review of Systems Pertinent items noted in HPI and remainder of comprehensive ROS otherwise negative.  Physical Exam:   Vitals:   09/30/23 1053 09/30/23 1100  BP: 128/86  Pulse: 89   Weight: 158 lb 11.2 oz (72 kg)   Height:  5' 2.5 (1.588 m)   Constitutional: Well-developed, well-nourished pregnant female in no acute distress.  HEENT: PERRLA Skin: normal color and turgor, no rash Cardiovascular: normal rate & rhythm, warm and well perfused Respiratory: normal effort, no problems with respiration noted GI: Abd soft, non-distended MS: Extremities nontender, no edema, normal ROM Neurologic: Alert and oriented x 4.  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 Encounter for supervision of low-risk pregnancy in first trimester (Primary) - Doing well, she and FOB Nicolina) are excited for the pregnancy - CBC/D/Plt+RPR+Rh+ABO+RubIgG...; Future - HgB A1c; Future - Vitamin D  (25 hydroxy); Future - Anemia Profile B; Future - Culture, OB Urine; Future - PANORAMA PRENATAL TEST; Future - HORIZON Basic Panel; Future - HORIZON Basic Panel - PANORAMA PRENATAL TEST - Culture, OB Urine - Anemia Profile B - Vitamin D  (25 hydroxy) - HgB A1c - CBC/D/Plt+RPR+Rh+ABO+RubIgG... - Culture, OB Urine - CHL AMB BABYSCRIPTS SCHEDULE OPTIMIZATION - US  MFM OB DETAIL +14 WK; Future  2. [redacted] weeks gestation of pregnancy - Routine OB care, will offer AFP at 15 weeks  3. History of iron  deficiency anemia - Resolved, can stop taking iron  supplement until 3rd trimester labs  4. Nausea/vomiting in pregnancy - Easing significantly  5. HSV infection - Taking suppressive Valtrex   6. Initial obstetric visit in first trimester - Initial labs reviewed - Continue prenatal vitamins. - Problem list reviewed and updated. - Genetic Screening discussed, First trimester screen, Quad screen, and NIPS: ordered. - Ultrasound discussed; fetal anatomic survey: ordered. - Anticipatory guidance about prenatal visits given including labs, ultrasounds, and testing. - Discussed usage of Babyscripts and virtual visits as additional source of managing and completing prenatal visits in midst of coronavirus and pandemic.   - Encouraged to complete MyChart Registration for her ability to review results, send requests, and have questions addressed.  - The nature of [facility] with multiple MDs and Advanced Practice Providers was explained to patient; also emphasized that residents, students are part of our team. - Routine obstetric precautions reviewed. Encouraged to seek out care  at office or emergency room [facility] preferred) for urgent and/or emergent concerns.  Return in about 4 weeks (around 10/28/2023) for IN-PERSON, LOB.    Future Appointments  Date Time Provider Department Center  10/28/2023 11:15 AM Vannie Cornell SAUNDERS, CNM [facility]    Cornell Vannie, MSN, CNM, IBCLC Certified Nurse Midwife, [facility]

## 2023-10-02 LAB — HORIZON CUSTOM: REPORT SUMMARY: POSITIVE — AB

## 2023-10-02 LAB — URINE CULTURE, OB REFLEX

## 2023-10-02 LAB — CULTURE, OB URINE

## 2023-10-23 ENCOUNTER — Encounter: Payer: Self-pay | Admitting: Certified Nurse Midwife

## 2023-10-23 DIAGNOSIS — D563 Thalassemia minor: Secondary | ICD-10-CM | POA: Insufficient documentation

## 2023-10-28 ENCOUNTER — Ambulatory Visit (INDEPENDENT_AMBULATORY_CARE_PROVIDER_SITE_OTHER): Payer: Self-pay | Admitting: Certified Nurse Midwife

## 2023-10-28 VITALS — BP 125/81 | HR 93 | Wt 164.4 lb

## 2023-10-28 DIAGNOSIS — N390 Urinary tract infection, site not specified: Secondary | ICD-10-CM

## 2023-10-28 DIAGNOSIS — B952 Enterococcus as the cause of diseases classified elsewhere: Secondary | ICD-10-CM

## 2023-10-28 DIAGNOSIS — Z3A15 15 weeks gestation of pregnancy: Secondary | ICD-10-CM | POA: Diagnosis not present

## 2023-10-28 DIAGNOSIS — Z3492 Encounter for supervision of normal pregnancy, unspecified, second trimester: Secondary | ICD-10-CM | POA: Diagnosis not present

## 2023-10-28 LAB — POCT URINALYSIS DIP (DEVICE)
Bilirubin Urine: NEGATIVE
Glucose, UA: NEGATIVE mg/dL
Hgb urine dipstick: NEGATIVE
Ketones, ur: NEGATIVE mg/dL
Leukocytes,Ua: NEGATIVE
Nitrite: NEGATIVE
Protein, ur: NEGATIVE mg/dL
Specific Gravity, Urine: 1.025 (ref 1.005–1.030)
Urobilinogen, UA: 0.2 mg/dL (ref 0.0–1.0)
pH: 7 (ref 5.0–8.0)

## 2023-10-28 NOTE — Progress Notes (Signed)
   PRENATAL VISIT NOTE  Subjective:  Crystal Rhodes is a 23 y.o. G2P0010 at [redacted]w[redacted]d being seen today for ongoing prenatal care.  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  Patient reports no complaints.  Contractions: Not present. Vag. Bleeding: None.   . Denies leaking of fluid.   The following portions of the patient's history were reviewed and updated as appropriate: allergies, current medications, past family history, past medical history, past social history, past surgical history and problem list.   Objective:    Vitals:   10/28/23 1157  BP: 125/81  Pulse: 93  Weight: 164 lb 6.4 oz (74.6 kg)    Fetal Status:  Fetal Heart Rate (bpm): 158        General: Alert, oriented and cooperative. Patient is in no acute distress.  Skin: Skin is warm and dry. No rash noted.   Cardiovascular: Normal heart rate noted  Respiratory: Normal respiratory effort, no problems with respiration noted  Abdomen: Soft, gravid, appropriate for gestational age.  Pain/Pressure: Present (mild, intermittent abdominal cramping and lower back pain)     Pelvic: Cervical exam deferred        Extremities: Normal range of motion.  Edema: None  Mental Status: Normal mood and affect. Normal behavior. Normal judgment and thought content.   Assessment and Plan:  Pregnancy: G2P0010 at [redacted]w[redacted]d 1. Encounter for supervision of low-risk pregnancy in second trimester - Doing well, feeling fetal movement  2. [redacted] weeks gestation of pregnancy - Routine OB care  - AFP, Serum, Open Spina Bifida  3. UTI (urinary tract infection) due to Enterococcus (Primary) - Last culture was positive, but pt completely asymptomatic. UA  normal today, will resend for culture and treat accordingly. - Culture, OB Urine  Preterm labor symptoms and general obstetric precautions including but not limited to vaginal bleeding, contractions, leaking of fluid and fetal movement were reviewed in detail with the patient. Please refer to After Visit Summary for other counseling recommendations.   Return in about 4 weeks (around 11/25/2023) for IN-PERSON, LOB.  Future Appointments  Date Time Provider Department Center  11/13/2023  8:00 AM Teague Gretta Darice FORBES DEVONNA [facility]  11/25/2023 11:15 AM Vannie Cornell SAUNDERS, CNM [facility]  12/04/2023  8:00 AM [facility] PROVIDER 1 [facility] [place]  12/04/2023  8:30 AM [facility] US1 [facility]    Cornell SAUNDERS Vannie, CNM

## 2023-10-30 LAB — AFP, SERUM, OPEN SPINA BIFIDA
AFP MoM: 1.13
AFP Value: 35.4 ng/mL
Gest. Age on Collection Date: 15.3 wk
Maternal Age At EDD: 25.2 a
OSBR Risk 1 IN: 10000
Test Results:: NEGATIVE
Weight: 164 [lb_av]

## 2023-10-30 LAB — URINE CULTURE, OB REFLEX

## 2023-10-30 LAB — CULTURE, OB URINE

## 2023-11-04 ENCOUNTER — Ambulatory Visit (INDEPENDENT_AMBULATORY_CARE_PROVIDER_SITE_OTHER): Admitting: Certified Nurse Midwife

## 2023-11-04 ENCOUNTER — Other Ambulatory Visit (HOSPITAL_COMMUNITY)
Admission: RE | Admit: 2023-11-04 | Discharge: 2023-11-04 | Disposition: A | Discharge status: Home / Self Care | Source: Ambulatory Visit | Attending: Certified Nurse Midwife | Admitting: Certified Nurse Midwife

## 2023-11-04 VITALS — BP 133/88 | HR 89

## 2023-11-04 DIAGNOSIS — R102 Pelvic and perineal pain: Secondary | ICD-10-CM | POA: Insufficient documentation

## 2023-11-04 DIAGNOSIS — Z3A16 16 weeks gestation of pregnancy: Secondary | ICD-10-CM

## 2023-11-04 DIAGNOSIS — N898 Other specified noninflammatory disorders of vagina: Secondary | ICD-10-CM

## 2023-11-04 DIAGNOSIS — O26892 Other specified pregnancy related conditions, second trimester: Secondary | ICD-10-CM

## 2023-11-04 DIAGNOSIS — O26899 Other specified pregnancy related conditions, unspecified trimester: Secondary | ICD-10-CM | POA: Insufficient documentation

## 2023-11-04 NOTE — Progress Notes (Signed)
   PRENATAL VISIT NOTE  Subjective:  Crystal Rhodes is a 23 y.o. G2P0010 at [redacted]w[redacted]d being seen today for ongoing prenatal care.  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  Patient reports pelvic pain/pressure, worse after being on her feet all day, no associated bleeding and not aggravated by movement.    . Vag. Bleeding: None.  Movement: Present. Denies leaking of fluid.   The following portions of the patient's history were reviewed and updated as appropriate: allergies, current medications, past family history, past medical history, past social history, past surgical history and problem list.   Objective:   Vitals:   11/04/23 0834  BP: 133/88  Pulse: 89   Fetal Status:  Fetal Heart Rate (bpm): 155   Movement: Present    General: Alert, oriented and cooperative. Patient is in no acute distress.  Skin: Skin is warm and dry. No rash noted.   Cardiovascular: Normal heart rate noted  Respiratory: Normal respiratory effort, no problems with respiration noted  Abdomen: Soft, gravid, appropriate for gestational age.  Pain/Pressure: Present     Pelvic: Thick, white discharge noted in vagina without bleeding, cervix visually closed.        Extremities: Normal range of motion.     Mental Status: Normal mood and affect. Normal behavior. Normal judgment and thought content.   Assessment and Plan:  Pregnancy: G2P0010 at [redacted]w[redacted]d 1. Pelvic pain and pressure in pregnancy (Primary) - Will check sources of infection - Culture, OB Urine - Cervicovaginal ancillary only( [place])  2. Vaginal discharge - Cervicovaginal ancillary only( )  Preterm labor symptoms and general obstetric  precautions including but not limited to vaginal bleeding, contractions, leaking of fluid and fetal movement were reviewed in detail with the patient. Please refer to After Visit Summary for other counseling recommendations.   Future Appointments  Date Time Provider Department Center  11/13/2023  8:00 AM Teague Gretta Darice FORBES DEVONNA [facility]  11/25/2023 11:15 AM Vannie Cornell SAUNDERS, CNM [facility]  12/04/2023  8:00 AM [facility] PROVIDER 1 [facility]  12/04/2023  8:30 AM [facility] US1 [facility]    Cornell SAUNDERS Vannie, CNM

## 2023-11-05 LAB — CERVICOVAGINAL ANCILLARY ONLY
Bacterial Vaginitis (gardnerella): NEGATIVE
Candida Glabrata: NEGATIVE
Candida Vaginitis: NEGATIVE
Comment: NEGATIVE
Comment: NEGATIVE
Comment: NEGATIVE

## 2023-11-06 LAB — URINE CULTURE, OB REFLEX

## 2023-11-06 LAB — CULTURE, OB URINE

## 2023-11-13 ENCOUNTER — Encounter: Admitting: Physician Assistant

## 2023-11-25 ENCOUNTER — Ambulatory Visit (INDEPENDENT_AMBULATORY_CARE_PROVIDER_SITE_OTHER): Payer: Self-pay | Admitting: Certified Nurse Midwife

## 2023-11-25 VITALS — BP 132/87 | HR 80 | Wt 172.2 lb

## 2023-11-25 DIAGNOSIS — Z3A19 19 weeks gestation of pregnancy: Secondary | ICD-10-CM

## 2023-11-25 DIAGNOSIS — Z3492 Encounter for supervision of normal pregnancy, unspecified, second trimester: Secondary | ICD-10-CM | POA: Diagnosis not present

## 2023-11-25 NOTE — Progress Notes (Signed)
   PRENATAL VISIT NOTE  Subjective:  B'Aisha Velador is a 23 y.o. G2P0010 at [redacted]w[redacted]d being seen today for ongoing prenatal care.  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  Patient reports no complaints.  Contractions: Not present. Vag. Bleeding: None.  Movement: Present. Denies leaking of fluid.   The following portions of the patient's history were reviewed and updated as appropriate: allergies, current medications, past family history, past medical history, past social history, past surgical history and problem list.   Objective:    Vitals:   11/25/23 1220  BP: 132/87  Pulse: 80  Weight: 172 lb 3.2 oz (78.1 kg)    Fetal Status:  Fetal Heart Rate (bpm): 150   Movement: Present    General: Alert, oriented and cooperative. Patient is in no acute distress.  Skin: Skin is warm and dry. No rash noted.   Cardiovascular: Normal heart rate noted  Respiratory: Normal respiratory effort, no problems with respiration noted  Abdomen: Soft, gravid, appropriate for gestational age.  Pain/Pressure: Absent     Pelvic: Cervical exam deferred        Extremities: Normal range of motion.  Edema: None  Mental Status: Normal mood and affect. Normal behavior. Normal judgment and thought content.   Assessment and Plan:  Pregnancy: G2P0010 at [redacted]w[redacted]d 1. Encounter for supervision of low-risk pregnancy in second trimester (Primary) - Doing well, starting to feel fetal movement   2. [redacted] weeks gestation of pregnancy - Routine OB care, has anatomy scan on 10/24  Preterm labor symptoms and general obstetric precautions including but not limited to vaginal bleeding, contractions, leaking of fluid and fetal  movement were reviewed in detail with the patient. Please refer to After Visit Summary for other counseling recommendations.   No follow-ups on file.  Future Appointments  Date Time Provider Department Center  12/04/2023  8:00 AM [facility] PROVIDER 1 [facility]  12/04/2023  8:30 AM [facility] US1 [facility]  12/23/2023 11:15 AM Vannie Cornell SAUNDERS, CNM [facility]    Cornell SAUNDERS Vannie, CNM

## 2023-12-04 ENCOUNTER — Other Ambulatory Visit: Payer: Self-pay | Admitting: *Deleted

## 2023-12-04 ENCOUNTER — Ambulatory Visit: Attending: Obstetrics and Gynecology | Admitting: Maternal & Fetal Medicine

## 2023-12-04 ENCOUNTER — Other Ambulatory Visit

## 2023-12-04 VITALS — BP 138/71 | HR 89

## 2023-12-04 DIAGNOSIS — O3412 Maternal care for benign tumor of corpus uteri, second trimester: Secondary | ICD-10-CM | POA: Insufficient documentation

## 2023-12-04 DIAGNOSIS — Z363 Encounter for antenatal screening for malformations: Secondary | ICD-10-CM | POA: Diagnosis present

## 2023-12-04 DIAGNOSIS — Z148 Genetic carrier of other disease: Secondary | ICD-10-CM | POA: Diagnosis not present

## 2023-12-04 DIAGNOSIS — O43192 Other malformation of placenta, second trimester: Secondary | ICD-10-CM | POA: Diagnosis not present

## 2023-12-04 DIAGNOSIS — Z3492 Encounter for supervision of normal pregnancy, unspecified, second trimester: Secondary | ICD-10-CM

## 2023-12-04 DIAGNOSIS — Z3A20 20 weeks gestation of pregnancy: Secondary | ICD-10-CM | POA: Insufficient documentation

## 2023-12-04 DIAGNOSIS — O43199 Other malformation of placenta, unspecified trimester: Secondary | ICD-10-CM

## 2023-12-04 DIAGNOSIS — D259 Leiomyoma of uterus, unspecified: Secondary | ICD-10-CM | POA: Insufficient documentation

## 2023-12-04 DIAGNOSIS — Z3491 Encounter for supervision of normal pregnancy, unspecified, first trimester: Secondary | ICD-10-CM | POA: Diagnosis not present

## 2023-12-04 NOTE — Progress Notes (Signed)
 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 G2P0010 at 107w5d here for ultrasound and consultation. She had low risk aneuploidy screening of a female fetus. Carrier screening was +SCAT. Maternal serum AFP was negative. She has no acute concerns.   Today we focused on the following:   Marginal cord insertion Marginal cord insertion was seen on today's ultrasound. There are no other evident fetal or placental abnormalities and the placenta is well away from the internal os.  I discussed the diagnosis, management and prognosis of pregnancy associated with the marginal umbilical cord insertion in pregnancy.  Normally the umbilical cord inserts centrally into the placenta, however, with a marginal cord insertion the umbilical cord inserts within less than 2 cm from the placental edge.  This is also known as a battledore placenta.  It occurs in about 6% of singleton pregnancies but is as high as 10 to 15% in twin pregnancies. Marginal cord insertion is even more common in monochorionic twins, and it has been associated with an increased risk for an SGA newborn in monochorionic but not dichorionic twin pregnancies. In singleton pregnancies, there are associations with adverse centric outcomes such as  placental abruption (odds ratio 1.5), placenta previa (odds ratio 1.8), and small-for-gestational age (SGA) neonate (odds ratio  1.2), but not perinatal deaths. I explained that because of the potential for fetal growth restriction, it is recommended that serial growth ultrasounds be performed during the pregnancy.  Sonographic findings Single intrauterine pregnancy at 20w 5d. Fetal cardiac activity:  Observed and appears normal. Presentation: Cephalic. The anatomic structures that were well seen appear normal without evidence of soft markers. The anatomic survey is complete.  Fetal biometry shows the estimated fetal weight at the 51 percentile. Amniotic fluid: Within normal limits.  MVP: 4.9 cm. Placenta: Posterior. Adnexa: No abnormality visualized. Cervical length: 3.8 cm.  There are limitations of prenatal ultrasound such as the inability to detect certain abnormalities due to poor visualization. Various factors such as fetal position, gestational age and maternal body habitus may increase the difficulty in visualizing the fetal anatomy.    Recommendations -EDD should be 04/17/2024 based on  LMP  (07/12/23). -Serial growth ultrasounds at 28 weeks every 4-6 weeks until delivery.  Review of Systems: A review of systems was performed and was negative except per HPI   Past Obstetrical History:  OB History  Gravida Para Term Preterm AB Living  2 0 0 0 1 0  SAB IAB Ectopic Multiple Live Births  0 1 0 0 0    # Outcome Date GA Lbr Len/2nd Weight Sex Type Anes PTL Lv  2 Current           1 IAB 02/14/22             Past Medical History:  Past Medical History:  Diagnosis Date   Anemia    Angio-edema    Eczema  STD (sexually transmitted disease)    chlamydia treated   Urticaria      Past Surgical History:    Past Surgical History:  Procedure Laterality Date   BREAST REDUCTION SURGERY  03/2022   EYE SURGERY Bilateral 2006     Home Medications:   Current Outpatient Medications on File Prior to Visit  Medication Sig Dispense Refill   aspirin EC 81 MG tablet Take 81 mg by mouth daily. Swallow  whole.     Prenatal Vit-Fe Fumarate-FA (PRENATAL PO) Take by mouth daily.     albuterol  (VENTOLIN  HFA) 108 (90 Base) MCG/ACT inhaler Inhale 2 puffs into the lungs every 6 (six) hours as needed for wheezing or shortness of breath. (Patient not taking: Reported on 10/28/2023) 8 g 2   EPINEPHrine  (EPIPEN  2-PAK) 0.3 mg/0.3 mL IJ SOAJ injection as directed Injection; Duration: 365 days     ondansetron  (ZOFRAN ) 4 MG tablet Take 1 tablet (4 mg total) by mouth every 8 (eight) hours as needed for nausea or vomiting. (Patient not taking: Reported on 10/28/2023) 20 tablet 0   scopolamine  (TRANSDERM-SCOP) 1 MG/3DAYS Place 1 patch (1.5 mg total) onto the skin every 3 (three) days. (Patient not taking: Reported on 10/28/2023) 10 patch 3   valACYclovir  (VALTREX ) 500 MG tablet Take 1 tablet (500 mg total) by mouth daily. Can increase to twice a day for 5 days in the event of a recurrence (Patient not taking: Reported on 10/28/2023) 30 tablet 12   Vitamin D , Ergocalciferol , (DRISDOL ) 1.25 MG (50000 UNIT) CAPS capsule Take 1 capsule (50,000 Units total) by mouth every 7 (seven) days for 12 doses. (Patient not taking: Reported on 12/04/2023) 12 capsule 0   No current facility-administered medications on file prior to visit.      Allergies:   Allergies  Allergen Reactions   Iron  Anaphylaxis    IV Iron  infusion   Venofer  [Iron  Sucrose] Hives, Swelling and Other (See Comments)    Stiffness and discoloration in left hand.     Physical Exam:   Vitals:   12/04/23 0805  BP: 138/71  Pulse: 89   Sitting comfortably on the sonogram table Nonlabored breathing Normal rate and rhythm Abdomen is nontender  Thank you for the opportunity to be involved with this patient's care. Please let us  know if we can be of any further assistance.   30 minutes of time was spent reviewing the patient's chart including labs, imaging and documentation.  At least 50% of this time was spent with direct patient care discussing the  diagnosis, management and prognosis of her care.  Delora Smaller MFM, [facility]   12/04/2023  10:39 AM

## 2023-12-09 ENCOUNTER — Other Ambulatory Visit: Payer: Self-pay

## 2023-12-09 ENCOUNTER — Other Ambulatory Visit: Payer: Self-pay | Admitting: Certified Nurse Midwife

## 2023-12-09 DIAGNOSIS — Z3492 Encounter for supervision of normal pregnancy, unspecified, second trimester: Secondary | ICD-10-CM

## 2023-12-09 MED ORDER — VITAFOL GUMMIES 3.33-0.333-34.8 MG PO CHEW: 3.0000 | CHEWABLE_TABLET | Freq: Every day | ORAL | 11 refills | Status: DC

## 2023-12-09 NOTE — Progress Notes (Signed)
 Prenatal vitamin prescription sent to preferred pharmacy.   Vernell RN

## 2023-12-11 ENCOUNTER — Other Ambulatory Visit: Payer: Self-pay | Admitting: Certified Nurse Midwife

## 2023-12-11 DIAGNOSIS — Z3492 Encounter for supervision of normal pregnancy, unspecified, second trimester: Secondary | ICD-10-CM

## 2023-12-14 ENCOUNTER — Encounter (HOSPITAL_COMMUNITY): Payer: Self-pay | Admitting: Obstetrics & Gynecology

## 2023-12-14 ENCOUNTER — Inpatient Hospital Stay (HOSPITAL_COMMUNITY)

## 2023-12-14 ENCOUNTER — Inpatient Hospital Stay (HOSPITAL_COMMUNITY)
Admission: AD | Admit: 2023-12-14 | Discharge: 2023-12-14 | Disposition: A | Discharge status: Home / Self Care | Attending: Obstetrics & Gynecology | Admitting: Obstetrics & Gynecology

## 2023-12-14 DIAGNOSIS — R1031 Right lower quadrant pain: Secondary | ICD-10-CM

## 2023-12-14 DIAGNOSIS — Z3A22 22 weeks gestation of pregnancy: Secondary | ICD-10-CM

## 2023-12-14 DIAGNOSIS — O43192 Other malformation of placenta, second trimester: Secondary | ICD-10-CM | POA: Diagnosis not present

## 2023-12-14 DIAGNOSIS — R109 Unspecified abdominal pain: Secondary | ICD-10-CM

## 2023-12-14 DIAGNOSIS — O3412 Maternal care for benign tumor of corpus uteri, second trimester: Secondary | ICD-10-CM | POA: Diagnosis not present

## 2023-12-14 DIAGNOSIS — D259 Leiomyoma of uterus, unspecified: Secondary | ICD-10-CM

## 2023-12-14 DIAGNOSIS — O26892 Other specified pregnancy related conditions, second trimester: Secondary | ICD-10-CM

## 2023-12-14 LAB — POCT URINALYSIS DIP (DEVICE)
Bilirubin Urine: NEGATIVE
Glucose, UA: NEGATIVE mg/dL
Hgb urine dipstick: NEGATIVE
Ketones, ur: NEGATIVE mg/dL
Leukocytes,Ua: NEGATIVE
Nitrite: NEGATIVE
Protein, ur: NEGATIVE mg/dL
Specific Gravity, Urine: 1.025 (ref 1.005–1.030)
Urobilinogen, UA: 0.2 mg/dL (ref 0.0–1.0)
pH: 7 (ref 5.0–8.0)

## 2023-12-14 MED ORDER — CYCLOBENZAPRINE HCL 5 MG PO TABS
10.0000 mg | ORAL_TABLET | Freq: Once | ORAL | Status: AC
  Administered 2023-12-14: 10 mg via ORAL
  Filled 2023-12-14: qty 2

## 2023-12-14 NOTE — MAU Provider Note (Signed)
 History     [id number]  Arrival date and time: 12/14/23 1340    Chief Complaint  Patient presents with   Abdominal Pain     HPI Crystal Rhodes is a 23 y.o. at [redacted]w[redacted]d by LMP c/w 6 wk US  with PMHx notable for n/a, who presents for pelvic pain.   Has been having pain in her R pelvic area that radiates to her hip and low back Started this morning Constant Worse with movement and peeing Urination especially makes it terrible No blood in urine No constipation No vaginal bleeding No unusual discharge, no gush of fluid Fetal movement is normal No personal or family hx of kidney stones No new or unusual activities over the weekend No falls or injury to those areas No fever, nausea, or vomiting Has tried heat, birthing ball, and tylenol , none of them have given relief  A/Positive/-- (08/13 1051)  OB History     Gravida  2   Para  0   Term  0   Preterm  0   AB  1   Living  0      SAB  0   IAB  1   Ectopic  0   Multiple  0   Live Births  0           Past Medical History:  Diagnosis Date   Anemia    Angio-edema    Eczema    STD (sexually transmitted disease)    chlamydia treated   Urticaria     Past Surgical History:  Procedure Laterality Date   BREAST REDUCTION SURGERY  03/2022   EYE SURGERY Bilateral 2006    Family History  Problem Relation Age of Onset   Hypertension Mother    Hypothyroidism Mother    Diabetes type II Father    Asthma Sister    Asthma Sister    Cancer Paternal Aunt    Heart Problems Paternal Aunt     Social History   Socioeconomic History   Marital status: Single    Spouse name: Not on file   Number of children: 0   Years of education: Not on file   Highest education level: Associate degree: academic program  Occupational History   Not on file  Tobacco Use   Smoking status: Never    Passive exposure: Past   Smokeless tobacco: Never  Vaping Use   Vaping status: Never Used  Substance and Sexual Activity    Alcohol use: Not Currently    Comment: occ   Drug use: No   Sexual activity: Yes    Partners: Male    Birth control/protection: None  Other Topics Concern   Not on file  Social History Narrative   Not on file   Social Drivers of Health   Financial Resource Strain: Not on file  Food Insecurity: No Food Insecurity (09/30/2023)   Hunger Vital Sign    Worried About Running Out of Food in the Last Year: Never true    Ran Out of Food in the Last Year: Never true  Transportation Needs: No Transportation Needs (09/30/2023)   PRAPARE - Administrator, Civil Service (Medical): No    Lack of Transportation (Non-Medical): No  Physical Activity: Not on file  Stress: Not on file  Social Connections: Not on file  Intimate Partner Violence: Not on file    Allergies  Allergen Reactions   Iron  Anaphylaxis    IV Iron  infusion   Venofer  [Iron  Sucrose] Hives,  Swelling and Other (See Comments)    Stiffness and discoloration in left hand.    No current facility-administered medications on file prior to encounter.   Current Outpatient Medications on File Prior to Encounter  Medication Sig Dispense Refill   aspirin EC 81 MG tablet Take 81 mg by mouth daily. Swallow whole.     Prenat w/o A-FE-Methf-FA-Omega (PNV-OMEGA) 28-0.6-0.4-340 MG CAPS Take 1 capsule by mouth daily after breakfast. 90 capsule 3   albuterol  (VENTOLIN  HFA) 108 (90 Base) MCG/ACT inhaler Inhale 2 puffs into the lungs every 6 (six) hours as needed for wheezing or shortness of breath. (Patient not taking: Reported on 10/28/2023) 8 g 2   EPINEPHrine  (EPIPEN  2-PAK) 0.3 mg/0.3 mL IJ SOAJ injection as directed Injection; Duration: 365 days     ondansetron  (ZOFRAN ) 4 MG tablet Take 1 tablet (4 mg total) by mouth every 8 (eight) hours as needed for nausea or vomiting. (Patient not taking: Reported on 10/28/2023) 20 tablet 0   scopolamine  (TRANSDERM-SCOP) 1 MG/3DAYS Place 1 patch (1.5 mg total) onto the skin every 3 (three) days.  (Patient not taking: Reported on 10/28/2023) 10 patch 3   valACYclovir  (VALTREX ) 500 MG tablet Take 1 tablet (500 mg total) by mouth daily. Can increase to twice a day for 5 days in the event of a recurrence (Patient not taking: Reported on 10/28/2023) 30 tablet 12     ROS Pertinent positives and negative per HPI, all others reviewed and negative  Physical Exam   BP 134/73 (BP Location: Right Arm)   Pulse 94   Temp 99 F (37.2 C) (Oral)   Resp 18   Ht 5' 2 (1.575 m)   Wt 80.7 kg   LMP 07/12/2023 (Exact Date)   SpO2 100%   BMI 32.56 kg/m   Patient Vitals for the past 24 hrs:  BP Temp Temp src Pulse Resp SpO2 Height Weight  12/14/23 1409 134/73 99 F (37.2 C) Oral 94 18 100 % 5' 2 (1.575 m) 80.7 kg    Physical Exam Vitals reviewed.  Constitutional:      General: She is not in acute distress.    Appearance: She is well-developed. She is not diaphoretic.  Eyes:     General: No scleral icterus. Pulmonary:     Effort: Pulmonary effort is normal. No respiratory distress.  Abdominal:     General: There is no distension.     Palpations: Abdomen is soft.     Tenderness: There is abdominal tenderness in the right lower quadrant. There is no guarding or rebound.  Skin:    General: Skin is warm and dry.  Neurological:     Mental Status: She is alert.     Coordination: Coordination normal.      Cervical Exam    Bedside Ultrasound Not performed.  My interpretation: n/a  FHT 150 bpm  Labs Results for orders placed or performed during the hospital encounter of 12/14/23 (from the past 24 hours)  POCT urinalysis dip (device)     Status: None   Collection Time: 12/14/23  1:38 PM  Result Value Ref Range   Glucose, UA NEGATIVE NEGATIVE mg/dL   Bilirubin Urine NEGATIVE NEGATIVE   Ketones, ur NEGATIVE NEGATIVE mg/dL   Specific Gravity, Urine 1.025 1.005 - 1.030   Hgb urine dipstick NEGATIVE NEGATIVE   pH 7.0 5.0 - 8.0   Protein, ur NEGATIVE NEGATIVE mg/dL    Urobilinogen, UA 0.2 0.0 - 1.0 mg/dL   Nitrite NEGATIVE NEGATIVE  Leukocytes,Ua NEGATIVE NEGATIVE    Imaging No results found.  MAU Course  Procedures Lab Orders         Culture, OB Urine         POCT urinalysis dip (device)    Meds ordered this encounter  Medications   cyclobenzaprine  (FLEXERIL ) tablet 10 mg   Imaging Orders         US  RENAL         US  MFM OB LIMITED      MDM Moderate (Level 3-4)  Assessment and Plan  #Abdominal pain in pregnancy, second trimester #[redacted] weeks gestation of pregnancy Suspect pain is due to degenerating fibroid. US  images reviewed personally. Location is consistent and in discussion with sonographer that particular area was very uncomfortable when scanning. Cervix long on US , low suspicion for preterm labor. Renal US  unremarkable. Discussed will have to wait it out, can take days to weeks for full degeneration. NSAIDs work best for pain but unfortunately not a great option.   #FWB FHR 150 bpm by doppler   Dispo: discharged to home in stable condition    Donnice CHRISTELLA Carolus, MD/MPH 12/14/23 4:24 PM  Allergies as of 12/14/2023       Reactions   Iron  Anaphylaxis   IV Iron  infusion   Venofer  [iron  Sucrose] Hives, Swelling, Other (See Comments)   Stiffness and discoloration in left hand.        Medication List     TAKE these medications    albuterol  108 (90 Base) MCG/ACT inhaler Commonly known as: VENTOLIN  HFA Inhale 2 puffs into the lungs every 6 (six) hours as needed for wheezing or shortness of breath.   aspirin EC 81 MG tablet Take 81 mg by mouth daily. Swallow whole.   EpiPen  2-Pak 0.3 mg/0.3 mL Soaj injection Generic drug: EPINEPHrine  as directed Injection; Duration: 365 days   ondansetron  4 MG tablet Commonly known as: Zofran  Take 1 tablet (4 mg total) by mouth every 8 (eight) hours as needed for nausea or vomiting.   PNV-Omega 28-0.6-0.4-340 MG Caps Take 1 capsule by mouth daily after breakfast.   scopolamine  1  MG/3DAYS Commonly known as: TRANSDERM-SCOP Place 1 patch (1.5 mg total) onto the skin every 3 (three) days.   valACYclovir  500 MG tablet Commonly known as: VALTREX  Take 1 tablet (500 mg total) by mouth daily. Can increase to twice a day for 5 days in the event of a recurrence

## 2023-12-14 NOTE — MAU Note (Signed)
 Crystal Rhodes is a 23 y.o. at [redacted]w[redacted]d here in MAU reporting: since she woke up this morning, has been having pain in Rt lower quad.  Tried Tylenol , hot compress, sitting on a preg ball.  Nothing is helping. Pain is worse when getting up, walking and with certain movement.  When she pees, there is a sharp stabbing pain, 10x worse .  Did a UA, that was clean, so her dr sent her up here. No bleeding, d/c or LOF.  Baby is moving.   Denies constipation.  Onset of complaint: this morning Pain score: 6+, when moving an 8, when she pees a 10 Vitals:   12/14/23 1409  BP: 134/73  Pulse: 94  Resp: 18  Temp: 99 F (37.2 C)  SpO2: 100%     FHT:150 Lab orders placed from triage:

## 2023-12-16 ENCOUNTER — Other Ambulatory Visit: Payer: Self-pay

## 2023-12-23 ENCOUNTER — Ambulatory Visit: Payer: Self-pay | Admitting: Certified Nurse Midwife

## 2023-12-23 VITALS — BP 124/85 | HR 92 | Wt 177.0 lb

## 2023-12-23 DIAGNOSIS — O26892 Other specified pregnancy related conditions, second trimester: Secondary | ICD-10-CM

## 2023-12-23 DIAGNOSIS — R102 Pelvic and perineal pain unspecified side: Secondary | ICD-10-CM | POA: Diagnosis not present

## 2023-12-23 DIAGNOSIS — D259 Leiomyoma of uterus, unspecified: Secondary | ICD-10-CM | POA: Diagnosis not present

## 2023-12-23 DIAGNOSIS — Z3492 Encounter for supervision of normal pregnancy, unspecified, second trimester: Secondary | ICD-10-CM

## 2023-12-23 DIAGNOSIS — Z3A23 23 weeks gestation of pregnancy: Secondary | ICD-10-CM

## 2023-12-23 NOTE — Progress Notes (Signed)
 PRENATAL VISIT NOTE  Subjective:  Crystal Rhodes is a 23 y.o. G2P0010 at [redacted]w[redacted]d being seen today for ongoing prenatal care.  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  Patient reports pain improved from last week with degenerating fibroid. Still has some pain if really busy throughout the day.  Contractions: Not present. Vag. Bleeding: None.  Movement: Present. Denies leaking of fluid.   The following portions of the patient's history were reviewed and updated as appropriate: allergies, current medications, past family history, past medical history, past social history, past surgical history and problem list.   Objective:   Vitals:   12/23/23 0827  BP: 124/85  Pulse: 92   Fetal Status:  Fetal Heart Rate (bpm): 144   Movement: Present    General: Alert, oriented and cooperative. Patient is in no acute distress.  Skin: Skin is warm and dry. No rash noted.   Cardiovascular: Normal heart rate noted  Respiratory: Normal respiratory effort, no problems with respiration noted  Abdomen: Soft, gravid, appropriate for gestational age.  Pain/Pressure: Absent     Pelvic: Cervical exam deferred        Extremities: Normal range of motion.  Edema: None  Mental Status: Normal mood and affect. Normal behavior. Normal judgment and thought content.      09/30/2023   12:12 PM 12/25/2022    9:28 AM  Depression screen PHQ 2/9  Decreased Interest 0 0  Down, Depressed, Hopeless 0 0  PHQ - 2 Score 0 0  Altered sleeping 0   Tired, decreased energy 1   Change in appetite 0   Feeling bad or  failure about yourself  0   Trouble concentrating 0   Moving slowly or fidgety/restless 0   Suicidal thoughts 0   PHQ-9 Score 1       Data saved with a previous flowsheet row definition        09/30/2023   12:15 PM  GAD 7 : Generalized Anxiety Score  Nervous, Anxious, on Edge 0  Control/stop worrying 1  Worry too much - different things 1  Trouble relaxing 0  Restless 0  Easily annoyed or irritable 3  Afraid - awful might happen 0  Total GAD 7 Score 5    Assessment and Plan:  Pregnancy: G2P0010 at [redacted]w[redacted]d 1. Encounter for supervision of low-risk pregnancy in second trimester (Primary) - Doing well, feeling regular and vigorous fetal movement - reassured that it is normal to feel SOME movement but not constant movement.   2. [redacted] weeks gestation of pregnancy - Routine OB care   3. Pelvic pain affecting pregnancy in second trimester, antepartum - Recommend pelvic floor physical therapy and aquatic therapy to help pelvic pain - Ambulatory referral to Physical Therapy - Ambulatory referral to Physical Therapy  4. Uterine leiomyoma, unspecified location - Degeneration pain has subsided  Preterm labor symptoms and general obstetric precautions including but not limited to vaginal bleeding, contractions, leaking of fluid and fetal movement were reviewed in detail with the patient. Please refer to After Visit Summary for other counseling recommendations.   Return in about 4 weeks (around 01/20/2024) for IN-PERSON, LOB/GTT.  Future Appointments  Date  Time Provider Department Center  01/20/2024  8:15 AM Vannie Cornell JONELLE ED[facility]  01/20/2024  8:20 AM [facility]  01/28/2024  1:15 PM [facility] PROVIDER 1 [facility]  01/28/2024  1:30 PM [facility] US3 [facility]  02/24/2024 12:30 PM Steer, Josette LABOR, PT [facility] None   Cornell JONELLE Vannie, CNM

## 2024-01-07 ENCOUNTER — Encounter (HOSPITAL_COMMUNITY): Payer: Self-pay | Admitting: Obstetrics and Gynecology

## 2024-01-07 ENCOUNTER — Inpatient Hospital Stay (HOSPITAL_COMMUNITY)

## 2024-01-07 ENCOUNTER — Inpatient Hospital Stay (HOSPITAL_COMMUNITY)
Admission: AD | Admit: 2024-01-07 | Discharge: 2024-01-07 | Disposition: A | Discharge status: Home / Self Care | Attending: Obstetrics and Gynecology | Admitting: Obstetrics and Gynecology

## 2024-01-07 DIAGNOSIS — Z3A25 25 weeks gestation of pregnancy: Secondary | ICD-10-CM | POA: Insufficient documentation

## 2024-01-07 DIAGNOSIS — O99612 Diseases of the digestive system complicating pregnancy, second trimester: Secondary | ICD-10-CM | POA: Diagnosis not present

## 2024-01-07 DIAGNOSIS — K8020 Calculus of gallbladder without cholecystitis without obstruction: Secondary | ICD-10-CM | POA: Diagnosis not present

## 2024-01-07 DIAGNOSIS — K8070 Calculus of gallbladder and bile duct without cholecystitis without obstruction: Secondary | ICD-10-CM | POA: Insufficient documentation

## 2024-01-07 DIAGNOSIS — Z3689 Encounter for other specified antenatal screening: Secondary | ICD-10-CM | POA: Diagnosis not present

## 2024-01-07 DIAGNOSIS — O26892 Other specified pregnancy related conditions, second trimester: Secondary | ICD-10-CM | POA: Insufficient documentation

## 2024-01-07 DIAGNOSIS — R1012 Left upper quadrant pain: Secondary | ICD-10-CM | POA: Insufficient documentation

## 2024-01-07 DIAGNOSIS — R030 Elevated blood-pressure reading, without diagnosis of hypertension: Secondary | ICD-10-CM | POA: Insufficient documentation

## 2024-01-07 HISTORY — DX: Benign neoplasm of connective and other soft tissue, unspecified: D21.9

## 2024-01-07 LAB — CBC
HCT: 31.4 % — ABNORMAL LOW (ref 36.0–46.0)
Hemoglobin: 9.9 g/dL — ABNORMAL LOW (ref 12.0–15.0)
MCH: 27 pg (ref 26.0–34.0)
MCHC: 31.5 g/dL (ref 30.0–36.0)
MCV: 85.8 fL (ref 80.0–100.0)
Platelets: 212 K/uL (ref 150–400)
RBC: 3.66 MIL/uL — ABNORMAL LOW (ref 3.87–5.11)
RDW: 14 % (ref 11.5–15.5)
WBC: 8.7 K/uL (ref 4.0–10.5)
nRBC: 0 % (ref 0.0–0.2)

## 2024-01-07 LAB — COMPREHENSIVE METABOLIC PANEL WITH GFR
ALT: 14 U/L (ref 0–44)
AST: 18 U/L (ref 15–41)
Albumin: 2.9 g/dL — ABNORMAL LOW (ref 3.5–5.0)
Alkaline Phosphatase: 78 U/L (ref 38–126)
Anion gap: 10 (ref 5–15)
BUN: 6 mg/dL (ref 6–20)
CO2: 21 mmol/L — ABNORMAL LOW (ref 22–32)
Calcium: 9.3 mg/dL (ref 8.9–10.3)
Chloride: 106 mmol/L (ref 98–111)
Creatinine, Ser: 0.65 mg/dL (ref 0.44–1.00)
GFR, Estimated: >60 mL/min (ref >60)
Glucose, Bld: 107 mg/dL — ABNORMAL HIGH (ref 70–99)
Potassium: 3.5 mmol/L (ref 3.5–5.1)
Sodium: 137 mmol/L (ref 135–145)
Total Bilirubin: 0.3 mg/dL (ref 0.0–1.2)
Total Protein: 6.4 g/dL — ABNORMAL LOW (ref 6.5–8.1)

## 2024-01-07 LAB — WET PREP, GENITAL
Clue Cells Wet Prep HPF POC: NONE SEEN
Sperm: NONE SEEN
Trich, Wet Prep: NONE SEEN
WBC, Wet Prep HPF POC: <10 (ref <10)
Yeast Wet Prep HPF POC: NONE SEEN

## 2024-01-07 LAB — URINALYSIS, ROUTINE W REFLEX MICROSCOPIC
Bilirubin Urine: NEGATIVE
Glucose, UA: NEGATIVE mg/dL
Ketones, ur: NEGATIVE mg/dL
Leukocytes,Ua: NEGATIVE
Nitrite: NEGATIVE
Protein, ur: NEGATIVE mg/dL
Specific Gravity, Urine: 1.011 (ref 1.005–1.030)
pH: 6 (ref 5.0–8.0)

## 2024-01-07 LAB — PROTEIN / CREATININE RATIO, URINE
Creatinine, Urine: 81 mg/dL
Total Protein, Urine: <6 mg/dL

## 2024-01-07 LAB — LIPASE, BLOOD: Lipase: 27 U/L (ref 11–51)

## 2024-01-07 MED ORDER — CYCLOBENZAPRINE HCL 10 MG PO TABS: 10.0000 mg | ORAL_TABLET | Freq: Two times a day (BID) | ORAL | 0 refills | Status: AC | PRN

## 2024-01-07 MED ORDER — CYCLOBENZAPRINE HCL 5 MG PO TABS
10.0000 mg | ORAL_TABLET | Freq: Once | ORAL | Status: AC
  Administered 2024-01-07: 10 mg via ORAL
  Filled 2024-01-07: qty 2

## 2024-01-07 MED ORDER — IBUPROFEN 600 MG PO TABS
600.0000 mg | ORAL_TABLET | Freq: Once | ORAL | Status: AC
Start: 2024-01-07 — End: 2024-01-07
  Administered 2024-01-07: 600 mg via ORAL
  Filled 2024-01-07: qty 1

## 2024-01-07 NOTE — MAU Note (Addendum)
 MAU Triage Note: Crystal Rhodes is a 23 y.o. at [redacted]w[redacted]d here in MAU reporting: shooting abdominal pain that she feels in her upper abdomen and wrapping around to her left side. The pain started at 0200 this AM and is worse with movement. Denies VB or LOF. Does report thin, white discharge since this AM. Reports +FM.  Has tried heat and tylenol  - last taken around 1300-1400. No response to interventions.  PIH Assessment: Headache present: No  Visual disturbances: None RUQ pain/Epigastric: None Atypical edema: None Hx of HBP: reports some elevated Bps at work BP Medications: bASA   Patient complaint: sever sharp pain on left side  Pain Score: 7  Pain Location: Abdomen     Onset of complaint: this AM LMP: Patient's last menstrual period was 07/12/2023 (exact date).  Vitals:   01/07/24 2025  BP: (!) 147/79  Pulse: (!) 108  Resp: 16  Temp: 98.3 F (36.8 C)  SpO2: 99%    FHT:  Fetal Heart Rate Mode: External Baseline Rate (A): 160 bpm Lab orders placed from triage: UA

## 2024-01-07 NOTE — MAU Provider Note (Signed)
 History     CSN: [id number]  Arrival date and time: 01/07/24 2006   Event Date/Time   First Provider Initiated Contact with Patient 01/07/24 2044      Chief Complaint  Patient presents with   Abdominal Pain   Crystal Rhodes is a 23 y.o. G2P0010 at [redacted]w[redacted]d who receives care at [facility].  She presents today for abdominal pain. She states the pain started at 0200 and was worsened with movement.  She states it is located on her left side and describes as sharp stabbing pain and shoots down the left side of my belly.  She states the pain has no relieving factors despite tylenol , warm compress, and stretching.  She rates the pain a 7/10. She denies visual disturbances, HA, or RUQ pain.  She denies vaginal bleeding or leaking, but reports thin white discharge that is without odor, irritation, or itching.    OB History     Gravida  2   Para  0   Term  0   Preterm  0   AB  1   Living  0      SAB  0   IAB  1   Ectopic  0   Multiple  0   Live Births  0           Past Medical History:  Diagnosis Date   Anemia    Angio-edema    Eczema    Fibroids    STD (sexually transmitted disease)    chlamydia treated   Urticaria     Past Surgical History:  Procedure Laterality Date   BREAST REDUCTION SURGERY  03/2022   EYE SURGERY Bilateral 2006    Family History  Problem Relation Age of Onset   Hypertension Mother    Hypothyroidism Mother    Diabetes type II Father    Asthma Sister    Asthma Sister    Cancer Paternal Aunt    Heart Problems Paternal Aunt     Social History   Tobacco Use   Smoking status: Never    Passive exposure: Past   Smokeless tobacco: Never  Vaping Use   Vaping status: Never Used  Substance Use Topics   Alcohol use: Not Currently    Comment: occ   Drug use: No    Allergies:  Allergies  Allergen Reactions   Iron  Anaphylaxis    IV Iron  infusion   Venofer  [Iron  Sucrose] Hives, Swelling and Other (See Comments)    Stiffness and  discoloration in left hand.    Medications Prior to Admission  Medication Sig Dispense Refill Last Dose/Taking   acetaminophen  (TYLENOL ) 500 MG tablet Take 1,000 mg by mouth every 6 (six) hours as needed.   01/07/2024 Evening   aspirin EC 81 MG tablet Take 81 mg by mouth daily. Swallow whole.   01/07/2024   Prenat w/o A-FE-Methf-FA-Omega (ZATEAN-PN PLUS) 28-0.6-0.4-340 MG CAPS TAKE 1 CAPSULE BY MOUTH DAILY AFTER BREAKFAST. 90 capsule 3 01/07/2024   albuterol  (VENTOLIN  HFA) 108 (90 Base) MCG/ACT inhaler Inhale 2 puffs into the lungs every 6 (six) hours as needed for wheezing or shortness of breath. (Patient not taking: Reported on 12/23/2023) 8 g 2    EPINEPHrine  (EPIPEN  2-PAK) 0.3 mg/0.3 mL IJ SOAJ injection as directed Injection; Duration: 365 days (Patient not taking: Reported on 12/23/2023)      ondansetron  (ZOFRAN ) 4 MG tablet Take 1 tablet (4 mg total) by mouth every 8 (eight) hours as needed for nausea or vomiting. (Patient not  taking: Reported on 12/23/2023) 20 tablet 0    scopolamine  (TRANSDERM-SCOP) 1 MG/3DAYS Place 1 patch (1.5 mg total) onto the skin every 3 (three) days. (Patient not taking: Reported on 12/23/2023) 10 patch 3    valACYclovir  (VALTREX ) 500 MG tablet Take 1 tablet (500 mg total) by mouth daily. Can increase to twice a day for 5 days in the event of a recurrence (Patient not taking: Reported on 12/23/2023) 30 tablet 12     Review of Systems  Respiratory:  Positive for shortness of breath (Occasionally with sitting.). Negative for cough.   Gastrointestinal:  Positive for abdominal pain (Lside). Negative for constipation, diarrhea, nausea and vomiting.  Genitourinary:  Positive for vaginal discharge. Negative for difficulty urinating, dysuria and vaginal bleeding.  Neurological:  Negative for dizziness, light-headedness and headaches.   Physical Exam   Blood pressure 132/74, pulse (!) 111, temperature 98.3 F (36.8 C), temperature source Oral, resp. rate 16, height 5'  2.5 (1.588 m), weight 81.9 kg, last menstrual period 07/12/2023, SpO2 98%, unknown if currently breastfeeding.  Vitals:   01/07/24 2025 01/07/24 2033  BP: (!) 147/79 132/74    Physical Exam Vitals and nursing note reviewed.  Constitutional:      Appearance: Normal appearance. She is well-developed.  HENT:     Head: Normocephalic and atraumatic.  Eyes:     Conjunctiva/sclera: Conjunctivae normal.  Cardiovascular:     Rate and Rhythm: Normal rate.  Pulmonary:     Effort: Pulmonary effort is normal. No respiratory distress.     Breath sounds: Normal breath sounds.  Abdominal:     Palpations: Abdomen is soft.     Tenderness: There is abdominal tenderness in the epigastric area and left upper quadrant. There is no right CVA tenderness or left CVA tenderness.     Comments: Gravid, Appears AGA  Musculoskeletal:        General: Normal range of motion.     Cervical back: Normal range of motion.  Skin:    General: Skin is warm and dry.  Neurological:     Mental Status: She is alert and oriented to person, place, and time.  Psychiatric:        Mood and Affect: Mood normal.        Behavior: Behavior normal.     Fetal Assessment 150 bpm, Mod Var, -Decels, -Accels Toco: Irritability   MAU Course   Results for orders placed or performed during the hospital encounter of 01/07/24 (from the past 24 hours)  Urinalysis, Routine w reflex microscopic -Urine, Clean Catch     Status: Abnormal   Collection Time: 01/07/24  8:12 PM  Result Value Ref Range   Color, Urine YELLOW YELLOW   APPearance CLEAR CLEAR   Specific Gravity, Urine 1.011 1.005 - 1.030   pH 6.0 5.0 - 8.0   Glucose, UA NEGATIVE NEGATIVE mg/dL   Hgb urine dipstick SMALL (A) NEGATIVE   Bilirubin Urine NEGATIVE NEGATIVE   Ketones, ur NEGATIVE NEGATIVE mg/dL   Protein, ur NEGATIVE NEGATIVE mg/dL   Nitrite NEGATIVE NEGATIVE   Leukocytes,Ua NEGATIVE NEGATIVE   RBC / HPF 0-5 0 - 5 RBC/hpf   WBC, UA 0-5 0 - 5 WBC/hpf    Bacteria, UA RARE (A) NONE SEEN   Squamous Epithelial / HPF 0-5 0 - 5 /HPF   Mucus PRESENT   Protein / creatinine ratio, urine     Status: None   Collection Time: 01/07/24  8:38 PM  Result Value Ref Range   Creatinine,  Urine 81 mg/dL   Total Protein, Urine <6 mg/dL   Protein Creatinine Ratio        0.00 - 0.15 mg/mg[Cre]  Wet prep, genital     Status: None   Collection Time: 01/07/24  9:11 PM  Result Value Ref Range   Yeast Wet Prep HPF POC NONE SEEN NONE SEEN   Trich, Wet Prep NONE SEEN NONE SEEN   Clue Cells Wet Prep HPF POC NONE SEEN NONE SEEN   WBC, Wet Prep HPF POC <10 <10   Sperm NONE SEEN   CBC     Status: Abnormal   Collection Time: 01/07/24  9:22 PM  Result Value Ref Range   WBC 8.7 4.0 - 10.5 K/uL   RBC 3.66 (L) 3.87 - 5.11 MIL/uL   Hemoglobin 9.9 (L) 12.0 - 15.0 g/dL   HCT 68.5 (L) 63.9 - 53.9 %   MCV 85.8 80.0 - 100.0 fL   MCH 27.0 26.0 - 34.0 pg   MCHC 31.5 30.0 - 36.0 g/dL   RDW 85.9 88.4 - 84.4 %   Platelets 212 150 - 400 K/uL   nRBC 0.0 0.0 - 0.2 %  Comprehensive metabolic panel with GFR     Status: Abnormal   Collection Time: 01/07/24  9:22 PM  Result Value Ref Range   Sodium 137 135 - 145 mmol/L   Potassium 3.5 3.5 - 5.1 mmol/L   Chloride 106 98 - 111 mmol/L   CO2 21 (L) 22 - 32 mmol/L   Glucose, Bld 107 (H) 70 - 99 mg/dL   BUN 6 6 - 20 mg/dL   Creatinine, Ser 9.34 0.44 - 1.00 mg/dL   Calcium 9.3 8.9 - 89.6 mg/dL   Total Protein 6.4 (L) 6.5 - 8.1 g/dL   Albumin 2.9 (L) 3.5 - 5.0 g/dL   AST 18 15 - 41 U/L   ALT 14 0 - 44 U/L   Alkaline Phosphatase 78 38 - 126 U/L   Total Bilirubin 0.3 0.0 - 1.2 mg/dL   GFR, Estimated >39 >39 mL/min   Anion gap 10 5 - 15  Lipase, blood     Status: None   Collection Time: 01/07/24  9:22 PM  Result Value Ref Range   Lipase 27 11 - 51 U/L   US  Abdomen Complete Result Date: 01/07/2024 EXAM: COMPLETE ABDOMINAL ULTRASOUND TECHNIQUE: Real-time ultrasonography of the abdomen was performed. COMPARISON: None available.  CLINICAL HISTORY: Left upper quadrant pain. FINDINGS: LIVER: The liver demonstrates normal echogenicity. No intrahepatic biliary ductal dilatation. No mass. BILIARY SYSTEM: Gallbladder is partially distended with a gallstone within. Negative sonographic Beverley sign is elicited. No pericholecystic fluid or wall thickening. Common bile duct measures 1.8 mm. KIDNEYS: Right kidney measures 10.7 cm. Left kidney measures 11.5 cm. Normal contour of kidneys. Normal cortical echogenicity. No hydronephrosis. No calculus. No mass. PANCREAS: Visualized portions of the pancreas are unremarkable. SPLEEN: No acute abnormality. Spleen is within normal limits in size. VESSELS: Visualized portion of the aorta is normal. Visualized portion of the inferior vena cava is normal. OTHER: No ascites. IMPRESSION: 1. Cholelithiasis without sonographic evidence of acute cholecystitis. Electronically signed by: Oneil Devonshire MD 01/07/2024 10:05 PM EST RP Workstation: [id number]     MDM Physical Exam Labs: CBC, CMP, PC Ratio, Lipase Measure BPQ15 min EFM Pain Management Ultrasound Prescription Assessment and Plan   23 year old G2P0010  SIUP at 25.4 weeks Cat I FT LUQ Pain Elevated BP  -POC Reviewed. -Exam performed and findings discussed. -Labs ordered -  Patient offered and accepts pain medication. Will give tylenol  and flexeril . -Send for US . Informed that US  will not focus on fetal anatomy.  -NST reactive for GA.  -Monitor and reassess.    Harlene LITTIE Duncans MSN, CNM 01/07/2024, 8:44 PM   Reassessment (10:25 PM) Vitals:   01/07/24 2025 01/07/24 2033 01/07/24 2052 01/07/24 2101  BP: (!) 147/79 132/74 (!) 143/80 136/77   01/07/24 2116 01/07/24 2152 01/07/24 2200 01/07/24 2215  BP: 126/69 129/68 121/69 135/75   01/07/24 2230  BP: 129/68    -Reviewed findings -Patient reports pain now 5/10.  -Patient offered and accepts prescription for flexeril . Will send to pharmacy. -BPs normotensive. -Discussed US  and lab  findings. -Reviewed need for dietary modifications to reduce incidences of gallbladder colic. Information placed in AVS.  -Patient currently taking iron  supplement. -Encouraged to call primary office or return to MAU if symptoms worsen or with the onset of new symptoms. -Discharged to home in improved condition.  Harlene LITTIE Duncans MSN, CNM Advanced Practice Provider, [facility]

## 2024-01-08 DIAGNOSIS — K8070 Calculus of gallbladder and bile duct without cholecystitis without obstruction: Secondary | ICD-10-CM | POA: Insufficient documentation

## 2024-01-13 ENCOUNTER — Other Ambulatory Visit

## 2024-01-13 ENCOUNTER — Ambulatory Visit: Admitting: Certified Nurse Midwife

## 2024-01-13 ENCOUNTER — Other Ambulatory Visit: Payer: Self-pay

## 2024-01-13 VITALS — BP 136/74 | HR 96 | Wt 182.1 lb

## 2024-01-13 DIAGNOSIS — Z3492 Encounter for supervision of normal pregnancy, unspecified, second trimester: Secondary | ICD-10-CM

## 2024-01-13 DIAGNOSIS — K8070 Calculus of gallbladder and bile duct without cholecystitis without obstruction: Secondary | ICD-10-CM

## 2024-01-13 DIAGNOSIS — Z3A26 26 weeks gestation of pregnancy: Secondary | ICD-10-CM

## 2024-01-13 NOTE — Patient Instructions (Signed)
   Considering Waterbirth? Guide for patients at [facility]) Why consider waterbirth? Gentle birth for babies  Less pain medicine used in labor  May allow for passive descent/less pushing  May reduce perineal tears  More mobility and instinctive maternal position changes  Increased maternal relaxation   Is waterbirth safe? What are the risks of infection, drowning or other complications? Infection:  Very low risk (3.7 % for tub vs 4.8% for bed)  7 in 8000 waterbirths with documented infection  Poorly cleaned equipment most common cause  Slightly lower group B strep transmission rate  Drowning  Maternal:  Very low risk  Related to seizures or fainting  Newborn:  Very low risk. No evidence of increased risk of respiratory problems in multiple large studies  Physiological protection from breathing under water  Avoid underwater birth if there are any fetal complications  Once baby's head is out of the water, keep it out.  Birth complication  Some reports of cord trauma, but risk decreased by bringing baby to surface gradually  No evidence of increased risk of shoulder dystocia. Mothers can usually change positions faster in water than in a bed, possibly aiding the maneuvers to free the shoulder.   There are 2 things you MUST do to have a waterbirth with [facility]: Attend a waterbirth class at [facility] at [facility]   3rd Wednesday of every month from 7-9 pm (virtual during COVID) [employer] at [email or web address] or [email or web address] or by calling [phone] Bring us  the certificate from the class to your prenatal appointment or send via MyChart Meet with a midwife at 36 weeks* to see if you can still plan a waterbirth and to sign the consent.   *We also recommend that you schedule as many of your prenatal visits with a midwife as possible.    Helpful information: You may want to bring a bathing suit top to the hospital  to wear during labor but this is optional.  All other supplies are provided by the hospital. Please arrive at the hospital with signs of active labor, and do not wait at home until late in labor. It takes 45 min- 1 hour for fetal monitoring, and check in to your room to take place, plus transport and filling of the waterbirth tub.    Things that would prevent you from having a waterbirth: Premature, <37wks  Previous cesarean birth  Presence of thick meconium-stained fluid  Multiple gestation (Twins, triplets, etc.)  Uncontrolled diabetes or gestational diabetes requiring medication  Hypertension diagnosed in pregnancy or preexisting hypertension (gestational hypertension, preeclampsia, or chronic hypertension) Fetal growth restriction (your baby measures less than 10th percentile on ultrasound) Heavy vaginal bleeding  Non-reassuring fetal heart rate  Active infection (MRSA, etc.). Group B Strep is NOT a contraindication for waterbirth.  If your labor has to be induced and induction method requires continuous monitoring of the baby's heart rate  Other risks/issues identified by your obstetrical provider   Please remember that birth is unpredictable. Under certain unforeseeable circumstances your provider may advise against giving birth in the tub. These decisions will be made on a case-by-case basis and with the safety of you and your baby as our highest priority.    Updated 05/15/21

## 2024-01-14 ENCOUNTER — Ambulatory Visit: Payer: Self-pay | Admitting: Certified Nurse Midwife

## 2024-01-14 LAB — GLUCOSE TOLERANCE, 2 HOURS W/ 1HR
Glucose, 1 hour: 146 mg/dL (ref 70–179)
Glucose, 2 hour: 78 mg/dL (ref 70–152)
Glucose, Fasting: 89 mg/dL (ref 70–91)

## 2024-01-14 LAB — ANEMIA PROFILE B
Basophils Absolute: 0 x10E3/uL (ref 0.0–0.2)
Basos: 0 %
EOS (ABSOLUTE): 0 x10E3/uL (ref 0.0–0.4)
Eos: 1 %
Ferritin: 19 ng/mL (ref 15–150)
Folate: 14.5 ng/mL (ref >3.0)
Hematocrit: 32.3 % — ABNORMAL LOW (ref 34.0–46.6)
Hemoglobin: 10.1 g/dL — ABNORMAL LOW (ref 11.1–15.9)
Immature Grans (Abs): 0.2 x10E3/uL — ABNORMAL HIGH (ref 0.0–0.1)
Immature Granulocytes: 3 %
Iron Saturation: 17 % (ref 15–55)
Iron: 87 ug/dL (ref 27–159)
Lymphocytes Absolute: 2.4 x10E3/uL (ref 0.7–3.1)
Lymphs: 30 %
MCH: 26.8 pg (ref 26.6–33.0)
MCHC: 31.3 g/dL — ABNORMAL LOW (ref 31.5–35.7)
MCV: 86 fL (ref 79–97)
Monocytes Absolute: 0.7 x10E3/uL (ref 0.1–0.9)
Monocytes: 9 %
Neutrophils Absolute: 4.6 x10E3/uL (ref 1.4–7.0)
Neutrophils: 57 %
Platelets: 223 x10E3/uL (ref 150–450)
RBC: 3.77 x10E6/uL (ref 3.77–5.28)
RDW: 13.2 % (ref 11.7–15.4)
Retic Ct Pct: 3.3 % — ABNORMAL HIGH (ref 0.6–2.6)
Total Iron Binding Capacity: 522 ug/dL — ABNORMAL HIGH (ref 250–450)
UIBC: 435 ug/dL — ABNORMAL HIGH (ref 131–425)
Vitamin B-12: 377 pg/mL (ref 232–1245)
WBC: 7.9 x10E3/uL (ref 3.4–10.8)

## 2024-01-14 LAB — HIV ANTIBODY (ROUTINE TESTING W REFLEX): HIV Screen 4th Generation wRfx: NONREACTIVE

## 2024-01-14 LAB — SYPHILIS: RPR W/REFLEX TO RPR TITER AND TREPONEMAL ANTIBODIES, TRADITIONAL SCREENING AND DIAGNOSIS ALGORITHM: RPR Ser Ql: NONREACTIVE

## 2024-01-14 NOTE — Progress Notes (Signed)
 PRENATAL VISIT NOTE  Subjective:  Crystal Rhodes is a 23 y.o. G2P0010 at [redacted]w[redacted]d being seen today for ongoing prenatal care.  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  Patient reports feeling better today, was seen in MAU for back and RUQ pain that turned out to be a gallstone.  Contractions: Not present. Vag. Bleeding: None.  Movement: Present. Denies leaking of fluid.   The following portions of the patient's history were reviewed and updated as appropriate: allergies, current medications, past family history, past medical history, past social history, past surgical history and problem list.   Objective:   Vitals:   01/13/24 1132  BP: 136/74  Pulse: 96  Weight: 182 lb 1.6 oz (82.6 kg)   Fetal Status:  Fetal Heart Rate (bpm): 145 Fundal Height: 27 cm Movement: Present    General: Alert, oriented and cooperative. Patient is in no acute distress.  Skin: Skin is warm and dry. No rash noted.   Cardiovascular: Normal heart rate noted  Respiratory: Normal respiratory effort, no problems with respiration noted  Abdomen: Soft, gravid, appropriate for gestational age.  Pain/Pressure: Present     Pelvic: Cervical exam deferred        Extremities: Normal range of motion.  Edema: None  Mental Status: Normal mood and affect. Normal behavior. Normal judgment and thought content.      09/30/2023   12:12 PM 12/25/2022    9:28 AM  Depression screen PHQ 2/9  Decreased Interest 0 0  Down, Depressed, Hopeless 0 0  PHQ - 2 Score 0 0  Altered sleeping 0    Tired, decreased energy 1   Change in appetite 0   Feeling bad or failure about yourself  0   Trouble concentrating 0   Moving slowly or fidgety/restless 0   Suicidal thoughts 0   PHQ-9 Score 1       Data saved with a previous flowsheet row definition       09/30/2023   12:15 PM  GAD 7 : Generalized Anxiety Score  Nervous, Anxious, on Edge 0  Control/stop worrying 1  Worry too much - different things 1  Trouble relaxing 0  Restless 0  Easily annoyed or irritable 3  Afraid - awful might happen 0  Total GAD 7 Score 5   Assessment and Plan:  Pregnancy: G2P0010 at [redacted]w[redacted]d 1. Encounter for supervision of low-risk pregnancy in second trimester (Primary) - Doing well, feeling regular and vigorous fetal movement   2. [redacted] weeks gestation of pregnancy - Routine OB care including GTT today and repeat anemia panel - To start taking Blood Builder supplement again to help anemia seen on previous panel  3. Cholelithiasis during pregnancy - Discussed gallbladder related dietary changes and importance of adhering to these to prevent future flares. Explained without changes, these are likely to get worse and can get to the point of requiring surgery during pregnancy which increases the risk of preterm delivery. Pt understanding. - Gallbladder eating plan in AVS  Preterm labor symptoms and general obstetric precautions including but not limited to vaginal bleeding, contractions, leaking of fluid and fetal movement were reviewed in detail with  the patient. Please refer to After Visit Summary for other counseling recommendations.   Return in about 2 weeks (around 01/27/2024) for IN-PERSON, LOB.  Future Appointments  Date Time Provider Department Center  01/28/2024  1:15 PM [facility] PROVIDER 1 [facility]  01/28/2024  1:30 PM [facility] US3 [facility]  02/10/2024  8:15 AM Vannie Cornell JONELLE EDDY [facility]  02/24/2024 12:30 PM Russell, Josette LABOR, PT [facility] None    Cornell JONELLE Vannie, CNM

## 2024-01-20 ENCOUNTER — Encounter: Payer: Self-pay | Admitting: Certified Nurse Midwife

## 2024-01-20 ENCOUNTER — Other Ambulatory Visit: Payer: Self-pay

## 2024-01-25 ENCOUNTER — Other Ambulatory Visit: Payer: Self-pay

## 2024-01-25 ENCOUNTER — Encounter: Payer: Self-pay | Admitting: Physical Therapy

## 2024-01-25 ENCOUNTER — Ambulatory Visit: Admitting: Physical Therapy

## 2024-01-25 DIAGNOSIS — R102 Pelvic and perineal pain unspecified side: Secondary | ICD-10-CM | POA: Diagnosis present

## 2024-01-25 DIAGNOSIS — O26892 Other specified pregnancy related conditions, second trimester: Secondary | ICD-10-CM | POA: Diagnosis present

## 2024-01-25 DIAGNOSIS — R252 Cramp and spasm: Secondary | ICD-10-CM | POA: Insufficient documentation

## 2024-01-25 NOTE — Therapy (Signed)
 OUTPATIENT PHYSICAL THERAPY FEMALE PELVIC EVALUATION   Patient Name: Crystal Rhodes MRN: [id number] DOB:06/05/2000, 23 y.o., female Today's Date: 01/25/2024  END OF SESSION:  PT End of Session - 01/25/24 1152     Visit Number 1    Date for Recertification  04/18/24    Authorization Type [employer]    PT Start Time 1145    PT Stop Time 1225    PT Time Calculation (min) 40 min    Activity Tolerance Patient tolerated treatment well    Behavior During Therapy WFL for tasks assessed/performed          Past Medical History:  Diagnosis Date   Anemia    Angio-edema    Eczema    Fibroids    STD (sexually transmitted disease)    chlamydia treated   Urticaria    Past Surgical History:  Procedure Laterality Date   BREAST REDUCTION SURGERY  03/2022   EYE SURGERY Bilateral 2006   Patient Active Problem List   Diagnosis Date Noted   Cholelithiasis during pregnancy 01/08/2024   Fibroid uterus 12/14/2023   Marginal insertion of umbilical cord affecting management of mother in second trimester 12/04/2023   Alpha thalassemia trait 10/23/2023   Vitamin D  deficiency 09/25/2023   Rubella non-immune status, antepartum 09/25/2023   Supervision of low-risk pregnancy 09/01/2023   Asthma 08/20/2023   Frequent headaches 07/03/2023   History of iron  deficiency anemia 12/12/2022   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 09/23/2022    PCP: Gerome Brunet  REFERRING PROVIDER: Vannie Cornell SAUNDERS, CNM   REFERRING DIAG: [phone] (ICD-10-CM) - Pelvic pain affecting pregnancy in second trimester, antepartum   THERAPY DIAG:  Cramp and spasm  Pelvic pain affecting pregnancy in second trimester, antepartum  Rationale for Evaluation and Treatment: Rehabilitation  ONSET DATE: 07/2023  SUBJECTIVE:                                                                                                                                                                                            SUBJECTIVE STATEMENT: due 04/17/24. Patient reports pelvic pain since she was pregnant and as the pernancy has increased. Tried a belly band and does not help. Try to do Kegels but not able to .  Fluid intake: water, juice  FUNCTIONAL LIMITATIONS: movements  PERTINENT HISTORY:  Medications for current condition: none Surgeries: see above Other: [place] Sexual abuse: No  PAIN:  Are you having pain? Yes NPRS scale: 2-9/10 Pain location: pelvic area  Pain type: dull and worse pain is deep ache; random pain that are sharp and shooting, spasms 2 x when using the  bathroom Pain description: intermittent   Aggravating factors: excessive movement Relieving factors: wait the pain out; when getting out of a chair has to press on the sacrum  PRECAUTIONS: Other: pregnant  RED FLAGS: None   WEIGHT BEARING RESTRICTIONS: No  FALLS:  Has patient fallen in last 6 months? No  OCCUPATION: CMA  ACTIVITY LEVEL : low  PLOF: Independent  PATIENT GOALS: reduce pain   BOWEL MOVEMENT: No issues   URINATION: Pain with urination: No Fully empty bladder: Yes:                                           Post-void dribble: No Stream: Strong Urgency: Yes , due to pregnancy Frequency:during the day 3 hours                                                        Nocturia: No   Leakage: none Pads/briefs: No  INTERCOURSE:not having intercourse due to pregnancy.    PREGNANCY: Vaginal deliveries 0 C-section deliveries 0 Currently pregnant Yes: due 04/15/24  PROLAPSE: None   OBJECTIVE:  Note: Objective measures were completed at Evaluation unless otherwise noted.   PATIENT SURVEYS:  PFIQ-7: 24 POPIQ-7 24  COGNITION: Overall cognitive status: Within functional limits for tasks assessed      FUNCTIONAL TESTS:  Single leg stance:  Rt:no hip drop  Lt:no hip drop Squat:no difficulties Bed mobility:   LUMBARAROM/PROM:  A/PROM A/PROM  Eval (% available)  Flexion full   Extension NT due to pregnancy  Right lateral flexion 75  Left lateral flexion 100  Right rotation 75  Left rotation 100   (Blank rows = not tested)  LOWER EXTREMITY ROM: bilateral hip ROM is full   LOWER EXTREMITY MMT: bilateral hip strength is 4/5  PALPATION:  General: tenderness located on the left suprapubic area, tightness in the left hip adductors; decreased movement of the L4-S 1 area  Pelvic Alignment: Left ilium is posteriorly rotated, tenderness located on the left SI joint                         TODAY'S TREATMENT:                                                                                                                              DATE: 01/25/24  EVAL Examination completed, findings reviewed, pt educated on POC, HEP, and female pelvic floor anatomy, reasoning with pelvic floor assessment internally with pt consent, and abdominal massage. Pt motivated to participate in PT and agreeable to attempt recommendations.     PATIENT EDUCATION:  01/25/24 Education details: Access Code: [email or web address] Person educated: Patient  Education method: Explanation, Demonstration, Tactile cues, Verbal cues, and Handouts Education comprehension: verbalized understanding, returned demonstration, verbal cues required, tactile cues required, and needs further education  HOME EXERCISE PROGRAM: 01/25/24 Access Code: [email or web address] URL: [email or web address] Date: 01/25/2024 Prepared by: Channing Pereyra  Program Notes sit on chair with yoga block between knees and move thigh forward and back 10 x 1 time per day.   Exercises - Quadruped Rocking Slow  - 1 x daily - 7 x weekly - 1 sets - 10 reps - Cat Cow  - 1 x daily - 7 x weekly - 1 sets - 10 reps  ASSESSMENT:  CLINICAL IMPRESSION: Patient is a 23 y.o. female who was seen today for physical therapy evaluation and treatment for pelvic pain affecting pregnancy. Patient reports her pain began when she was pregnant 6/25. Her pain  level is 2-9/10 and comes on with activity or during urination. She has tenderness located on the left SI joint and superior   pubic bone on left.  Decreased movement of L4-S1. he has tightness along the left hip adductors. Left ilium is rotated posteriorly.  She has to hold her sacrum when going from sit to stand due to pain. She will benefit from skilled therapy to reduce her r pain and improve her function as her pregnancy progresses.   OBJECTIVE IMPAIRMENTS: decreased activity tolerance, increased muscle spasms, and pain.   ACTIVITY LIMITATIONS: sitting, standing, squatting, and locomotion level  PARTICIPATION LIMITATIONS: community activity and occupation  PERSONAL FACTORS: Time since onset of injury/illness/exacerbation are also affecting patient's functional outcome.   REHAB POTENTIAL: Excellent  CLINICAL DECISION MAKING: Stable/uncomplicated  EVALUATION COMPLEXITY: Low   GOALS: Goals reviewed with patient? Yes  SHORT TERM GOALS: Target date: 02/22/24  Patient independent with initial HEP for flexibility and strength of SI and lumbar spine.  Baseline: Goal status: INITIAL  2.  Patient is able to go from sit to stand and not hold onto the sacrum 2 out of 10 times due to increased in stability.  Baseline:  Goal status: INITIAL   LONG TERM GOALS: Target date: 04/18/24  Patient is independent with advanced HEP for lumbar SI stability for work and home activities to reduce occurrence of pain .  Baseline:  Goal status: INITIAL  2.  Patient is able to go from sit to stand without holding onto her sacrum 5 out of 10 times due to increased in stability.  Baseline:  Goal status: INITIAL  3.  Patient  is able to work with her pain level </= 4/10 due to increased in strength and understanding how to manage her pain.  Baseline:  Goal status: INITIAL  4.  Patient educated on ways to move the baby through the different stages of the pelvis to prepare for birth and reduce increase in  back pain.  Baseline:  Goal status: INITIAL   PLAN:  PT FREQUENCY: 2x/week  PT DURATION: 12 weeks  PLANNED INTERVENTIONS: 97110-Therapeutic exercises, 97530- Therapeutic activity, 97112- Neuromuscular re-education, 97535- Self Care, [id number]- Manual therapy, [phone]- Aquatic Therapy, Patient/Family education, Joint mobilization, Spinal mobilization, Cryotherapy, Moist heat, and Biofeedback  PLAN FOR NEXT SESSION: Aquatics- exercises for SI And lumbar stability; Pelvic SI mobility exercises, mobilize the lumbar spine, check SI alignment, quadruped with yoga block, off centered exercises   Channing Pereyra, PT 01/25/2024 2:01 PM

## 2024-01-25 NOTE — Patient Instructions (Signed)
 [facility]  Welcome to [facility]! Here you will find all the information you will need regarding your pool therapy. If you have further questions at any time, please call our office at [phone]. After completing your initial evaluation in the [facility], you may be eligible to complete a portion of your therapy in the pool. A typical week of therapy will consist of 1-2 typical physical therapy visits at our [facility] location and an additional session of therapy in the pool located at the [facility] at [facility]. [address], [place] [address]. The phone number at the pool site is [phone]. Please call this number if you are running late or need to cancel your appointment.  Check-in on MyChart then meet your therapist at the pool deck. (If you can't access MyChart, you may check in with the therapist at the pool deck.)   Each session will last approximately 45 minutes. All scheduling and payments for aquatic therapy sessions, including cancelations, will be done through our [facility] location.  To be eligible for aquatic therapy, these criteria must be met: You must be able to independently change in the locker room and get to the pool deck. A caregiver can come with you to help if needed however they do need to be the same sex to enter the locker room. Or you may change in a bathroom privately with opposite sex caregiver if needed. There are benches for a caregiver to sit on next to the pool.  Handicap parking is available in the front and there is a drop off option for even closer accessibility.  Please arrive 15 minutes prior to your appointment to prepare for your pool session. You must sign in at the front desk upon your arrival. Please be sure to attend to any toileting needs prior to entering the pool. Locker rooms for changing are available.  There is direct access to the pool deck from the locker  room. You can lock your belongings in a locker or bring them with you poolside. Your therapist will greet you on the pool deck. There may be other swimmers in the pool at the same time but your session is one-on-one with the therapist.    What to Expect Arrive 15 min early for your appointment and check in with rehab front desk. Please limit use of body lotions and hair products before entering the pool. Locker rooms are available for showering, changing and toileting. Appointments are 45 minutes with your therapist. (This does not include changing times) The pool is approximately 500 feet from the nearest parking lot. There are benches and chairs along the walk. Please bring a support person if you need assistance traveling the distanceto the pool or assistance with changing/toileting. Stairs with handrails as well as a lift chair are available at the pool.  Depth is 36-48 and temperature is between 88-90 degrees. The pool deck is tile flooring and gets slippery, water shoes are strongly encouraged but not required. Please wear a bathing suit or athletic shorts and a t-shirt. Recommended to bring your own towel. Severe weather:Thunder or lightning results in closure of the pool deck for 30 minutes and is extended with each incidence. Your appointment may be moved to land or canceled with the option to reschedule. Tell your therapist if you have any of the following: Open wounds Active infection Fear of water Bowel or bladder incontinence  Benefits of Aquatic Therapy:  Reduces Stress on Joints  and Muscles  The buoyancy of water supports body weight, making movement easier and less painful.  Builds Strength and Stability  The viscosity of water provides resistance that allows individuals to strengthen muscles while also providing a safe environment to improve balance and coordination.  Promotes Relaxation  The warm water and the feeling of being supported can help reduce stress and be  beneficial for overall well-being.     [facility] [address], [address] [place], [place] [address] Phone # [phone] Fax [phone]

## 2024-01-25 NOTE — Progress Notes (Deleted)
 OUTPATIENT PHYSICAL THERAPY FEMALE PELVIC EVALUATION   Patient Name: Crystal Rhodes MRN: [id number] DOB:February 18, 2000, 23 y.o., female Today's Date: 01/25/2024  END OF SESSION:   Past Medical History:  Diagnosis Date   Anemia    Angio-edema    Eczema    Fibroids    STD (sexually transmitted disease)    chlamydia treated   Urticaria    Past Surgical History:  Procedure Laterality Date   BREAST REDUCTION SURGERY  03/2022   EYE SURGERY Bilateral 2006   Patient Active Problem List   Diagnosis Date Noted   Cholelithiasis during pregnancy 01/08/2024   Fibroid uterus 12/14/2023   Marginal insertion of umbilical cord affecting management of mother in second trimester 12/04/2023   Alpha thalassemia trait 10/23/2023   Vitamin D  deficiency 09/25/2023   Rubella non-immune status, antepartum 09/25/2023   Supervision of low-risk pregnancy 09/01/2023   Asthma 08/20/2023   Frequent headaches 07/03/2023   History of iron  deficiency anemia 12/12/2022   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  Rationale for Evaluation and Treatment: Rehabilitation  ONSET DATE: ***  SUBJECTIVE:                                                                                                                                                                                           SUBJECTIVE STATEMENT: [redacted]w[redacted]d; pelvic pain in pregnancy ; due date 04/17/24.  Fluid intake:   FUNCTIONAL LIMITATIONS: ***  PERTINENT HISTORY:  Medications for current condition: none Surgeries: See above.  Other: Fibroids; Urticaria Sexual abuse: {Yes/No:304960894}  PAIN:  Are you having pain? {yes/no:20286} NPRS scale: ***/10 Pain location: {pelvic pain location:27098}  Pain type:  {type:313116} Pain description: {PAIN DESCRIPTION:21022940}   Aggravating factors: *** Relieving factors: ***  PRECAUTIONS: {Therapy precautions:24002}  RED FLAGS: {PT Red Flags:29287}   WEIGHT BEARING RESTRICTIONS: {Yes ***/No:24003}  FALLS:  Has patient fallen in last 6 months? {fallsyesno:27318}  OCCUPATION: ***  ACTIVITY LEVEL : ***  PLOF: {PLOF:24004}  PATIENT GOALS: ***   BOWEL MOVEMENT: Pain with bowel movement: {yes/no:20286} Type of bowel movement:{PT BM type:27100} Fully empty rectum: {No/Yes:304960894} Leakage: {Yes/No:304960894}                                                  Caused by: *** Bowel  urgency: *** Pads: {Yes/No:304960894} Fiber supplement/laxative {YES/NO AS:20300}  URINATION: Pain with urination: {yes/no:20286} Fully empty bladder: {Yes/No:304960894}***                                         Post-void dribble: {YES/NO AS:20300} Stream: {PT urination:27102} Urgency: {YES/NO AS:20300} Frequency:during the day ***                                                        Nocturia: {Yes/No:304960894}***   Leakage: {PT leakage:27103} Pads/briefs: {Yes/No:304960894}  INTERCOURSE:  Ability to have vaginal penetration {YES/NO:21197} Pain with intercourse: {pain with intercourse PA:27099} Dryness: {YES/NO AS:20300} Climax: *** Marinoff Scale: ***/3 Lubricant:  PREGNANCY: Vaginal deliveries *** Tearing {Yes***/No:304960894} Episiotomy {YES/NO AS:20300} C-section deliveries *** Currently pregnant {Yes***/No:304960894}  PROLAPSE: {PT prolapse:27101}   OBJECTIVE:  Note: Objective measures were completed at Evaluation unless otherwise noted.  DIAGNOSTIC FINDINGS:  Post-void residual: Voiding Cystourethrogram (VCUG):  Ultrasound: ***  PATIENT SURVEYS:  {rehab surveys:24030}  PFIQ-7: *** UIQ-7 *** CRAIG -7 *** POPIQ-7 *** Female Sexual Function Index (FSFI) Questionnaire ***  COGNITION: Overall cognitive status:  {cognition:24006}     SENSATION: Light touch: {intact/deficits:24005}  LUMBAR SPECIAL TESTS:  {lumbar special test:25242}  FUNCTIONAL TESTS:  {Functional tests:24029} Single leg stance:  Rt:  Lt: Sit-up test: Squat: Bed mobility:  GAIT: Assistive device utilized: {Assistive devices:23999} Comments: ***  POSTURE: {posture:25561}   LUMBARAROM/PROM:  A/PROM A/PROM  Eval (% available)  Flexion   Extension   Right lateral flexion   Left lateral flexion   Right rotation   Left rotation    (Blank rows = not tested)  LOWER EXTREMITY ROM:  {AROM/PROM:27142} ROM Right eval Left eval  Hip flexion    Hip extension    Hip abduction    Hip adduction    Hip internal rotation    Hip external rotation    Knee flexion    Knee extension    Ankle dorsiflexion    Ankle plantarflexion    Ankle inversion    Ankle eversion     (Blank rows = not tested)  LOWER EXTREMITY MMT:  MMT Right eval Left eval  Hip flexion    Hip extension    Hip abduction    Hip adduction    Hip internal rotation    Hip external rotation    Knee flexion    Knee extension    Ankle dorsiflexion    Ankle plantarflexion    Ankle inversion    Ankle eversion     (Blank rows = not tested) PALPATION:  General: ***  Pelvic Alignment: ***  Abdominal: ***  Diastasis: {Yes/No:304960894}*** Distortion: {YES/NO AS:20300}  Breathing: *** Scar tissue: {Yes/No:304960894}*** Active Straight Leg Raise: ***                External Perineal Exam: ***                             Internal Pelvic Floor: ***  Patient confirms identification and approves PT to assess internal pelvic floor and treatment {yes/no:20286} All internal or external pelvic floor assessments and/or treatments are completed with proper hand hygiene and gloves hands. If needed gloves are changed  with hand hygiene during patient care time.  PELVIC MMT:   MMT eval  Vaginal   Internal Anal Sphincter   External Anal Sphincter    Puborectalis   (Blank rows = not tested)        TONE: ***  PROLAPSE: ***  TODAY'S TREATMENT:                                                                                                                              DATE: ***  EVAL ***   PATIENT EDUCATION:  Education details: *** Person educated: {Person educated:25204} Education method: {Education Method:25205} Education comprehension: {Education Comprehension:25206}  HOME EXERCISE PROGRAM: ***  ASSESSMENT:  CLINICAL IMPRESSION: Patient is a *** y.o. *** who was seen today for physical therapy evaluation and treatment for ***.   OBJECTIVE IMPAIRMENTS: {opptimpairments:25111}.   ACTIVITY LIMITATIONS: {activitylimitations:27494}  PARTICIPATION LIMITATIONS: {participationrestrictions:25113}  PERSONAL FACTORS: {Personal factors:25162} are also affecting patient's functional outcome.   REHAB POTENTIAL: {rehabpotential:25112}  CLINICAL DECISION MAKING: {clinical decision making:25114}  EVALUATION COMPLEXITY: {Evaluation complexity:25115}   GOALS: Goals reviewed with patient? {yes/no:20286}  SHORT TERM GOALS: Target date: ***  *** Baseline: Goal status: INITIAL  2.  *** Baseline:  Goal status: INITIAL  3.  *** Baseline:  Goal status: INITIAL  4.  *** Baseline:  Goal status: INITIAL  5.  *** Baseline:  Goal status: INITIAL  6.  *** Baseline:  Goal status: INITIAL  LONG TERM GOALS: Target date: ***  *** Baseline:  Goal status: INITIAL  2.  *** Baseline:  Goal status: INITIAL  3.  *** Baseline:  Goal status: INITIAL  4.  *** Baseline:  Goal status: INITIAL  5.  *** Baseline:  Goal status: INITIAL  6.  *** Baseline:  Goal status: INITIAL  PLAN:  PT FREQUENCY: {rehab frequency:25116}  PT DURATION: {rehab duration:25117}  PLANNED INTERVENTIONS: {rehab planned interventions:25118::97110-Therapeutic exercises,97530- Therapeutic [phone]- Neuromuscular  re-education,97535- Self Rjmz,02859- Manual therapy,Patient/Family education}  PLAN FOR NEXT SESSION: ***   Jerlene Rockers, PT 01/25/2024, 8:45 AM

## 2024-01-28 ENCOUNTER — Ambulatory Visit: Attending: Maternal & Fetal Medicine | Admitting: Maternal & Fetal Medicine

## 2024-01-28 ENCOUNTER — Ambulatory Visit

## 2024-01-28 VITALS — BP 139/69 | HR 111

## 2024-01-28 DIAGNOSIS — O3413 Maternal care for benign tumor of corpus uteri, third trimester: Secondary | ICD-10-CM | POA: Insufficient documentation

## 2024-01-28 DIAGNOSIS — K8020 Calculus of gallbladder without cholecystitis without obstruction: Secondary | ICD-10-CM | POA: Insufficient documentation

## 2024-01-28 DIAGNOSIS — D563 Thalassemia minor: Secondary | ICD-10-CM | POA: Diagnosis not present

## 2024-01-28 DIAGNOSIS — O43192 Other malformation of placenta, second trimester: Secondary | ICD-10-CM

## 2024-01-28 DIAGNOSIS — Z362 Encounter for other antenatal screening follow-up: Secondary | ICD-10-CM | POA: Insufficient documentation

## 2024-01-28 DIAGNOSIS — O99613 Diseases of the digestive system complicating pregnancy, third trimester: Secondary | ICD-10-CM | POA: Insufficient documentation

## 2024-01-28 DIAGNOSIS — D259 Leiomyoma of uterus, unspecified: Secondary | ICD-10-CM | POA: Insufficient documentation

## 2024-01-28 DIAGNOSIS — Z148 Genetic carrier of other disease: Secondary | ICD-10-CM | POA: Insufficient documentation

## 2024-01-28 DIAGNOSIS — O99013 Anemia complicating pregnancy, third trimester: Secondary | ICD-10-CM | POA: Diagnosis not present

## 2024-01-28 DIAGNOSIS — O43193 Other malformation of placenta, third trimester: Secondary | ICD-10-CM | POA: Insufficient documentation

## 2024-01-28 DIAGNOSIS — Z3A28 28 weeks gestation of pregnancy: Secondary | ICD-10-CM

## 2024-01-28 DIAGNOSIS — O43199 Other malformation of placenta, unspecified trimester: Secondary | ICD-10-CM

## 2024-01-28 NOTE — Progress Notes (Signed)
 Patient information  Patient Name: Crystal Rhodes  Patient MRN:   [id number]  Referring practice: MFM Referring Provider: [facility])  Problem List   Patient Active Problem List   Diagnosis Date Noted   Cholelithiasis during pregnancy 01/08/2024   Fibroid uterus 12/14/2023   Marginal insertion of umbilical cord affecting management of mother in second trimester 12/04/2023   Alpha thalassemia trait 10/23/2023   Vitamin D  deficiency 09/25/2023   Rubella non-immune status, antepartum 09/25/2023   Supervision of low-risk pregnancy 09/01/2023   Asthma 08/20/2023   Frequent headaches 07/03/2023   History of iron  deficiency anemia 12/12/2022   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 G2P0010 at [redacted]w[redacted]d here for ultrasound and consultation. Crystal Rhodes is doing well today with no acute concerns. Today we focused on the following:   Patient is here for an ultrasound due to a marginal cord insertion and uterine fibroids which are small.  Discussed that the fibroids are unlikely to complicate her pregnancy.  However, she will need growth ultrasounds due to the marginal cord insertion.  Fetal growth is at the 59th percentile today.  Standard OB precautions were given to the patient. The patient had time to ask questions that were answered to her satisfaction.  She verbalized understanding and agrees to proceed with the plan above.   I spent 20 minutes reviewing the patients chart, including labs and images as well as counseling the patient about her medical conditions. Greater than 50% of the time was spent in direct face-to-face patient counseling.  Delora Smaller  MFM, [place]   01/28/2024  2:30 PM   Review of Systems: A review of systems was performed and was negative except per HPI   Vitals and Physical Exam    01/28/2024    1:15 PM 01/13/2024   11:32 AM  01/07/2024   10:30 PM  Vitals with BMI  Weight  182 lbs 2 oz   BMI  32.76   Systolic 139 136 870  Diastolic 69 74 68  Pulse 111 96 92    Sitting comfortably on the sonogram table Nonlabored breathing Normal rate and rhythm Abdomen is nontender  Past pregnancies OB History  Gravida Para Term Preterm AB Living  2 0 0 0 1 0  SAB IAB Ectopic Multiple Live Births  0 1 0 0 0    # Outcome Date GA Lbr Len/2nd Weight Sex Type Anes PTL Lv  2 Current           1 IAB 02/14/22             Future Appointments  Date Time Provider Department Center  02/03/2024 10:15 AM Michaele Delon CROME [facility] [address]  02/05/2024  1:45 PM Carlson-Long, Delon [facility] [address]  02/10/2024  8:15 AM Vannie Cornell SAUNDERS, CNM [facility]  02/16/2024  4:00 PM Elnor Channing FALCON, PT [facility]  02/24/2024 12:30 PM Russell Neptune A, PT [facility] None  02/25/2024  3:30 PM Michaele Delon [facility] [address]  03/02/2024  8:45 AM Elnor Channing F, PT [facility] None  03/04/2024  1:45 PM Judythe Delon CROME, PTA [facility] None  03/10/2024  4:00 PM Elnor Channing FALCON, PT [facility]  03/11/2024  3:15 PM Judythe Delon CROME, PTA [facility] None  03/15/2024  9:30 AM Elnor Channing FALCON, PT [facility]  03/18/2024  3:15 PM Judythe Delon  L, PTA [facility] None  03/22/2024  9:30 AM Elnor Channing FALCON, PT [facility]  03/25/2024  2:30 PM Judythe Delon CROME, PTA [facility] None  04/01/2024  2:30 PM Judythe Delon CROME, PTA [facility] None  04/08/2024  2:30 PM Judythe Delon CROME, PTA [facility] None  04/12/2024  8:30 AM Elnor Channing FALCON, PT [facility]  04/15/2024  2:30 PM Judythe Delon CROME, PTA [facility] None

## 2024-02-01 ENCOUNTER — Encounter: Payer: Self-pay | Admitting: General Practice

## 2024-02-03 ENCOUNTER — Encounter (HOSPITAL_BASED_OUTPATIENT_CLINIC_OR_DEPARTMENT_OTHER): Payer: Self-pay | Admitting: Physical Therapy

## 2024-02-03 ENCOUNTER — Ambulatory Visit (HOSPITAL_BASED_OUTPATIENT_CLINIC_OR_DEPARTMENT_OTHER): Payer: Self-pay | Admitting: Physical Therapy

## 2024-02-03 DIAGNOSIS — R102 Pelvic and perineal pain unspecified side: Secondary | ICD-10-CM | POA: Insufficient documentation

## 2024-02-03 DIAGNOSIS — R252 Cramp and spasm: Secondary | ICD-10-CM | POA: Diagnosis present

## 2024-02-03 DIAGNOSIS — O26892 Other specified pregnancy related conditions, second trimester: Secondary | ICD-10-CM | POA: Diagnosis present

## 2024-02-03 NOTE — Therapy (Signed)
 " OUTPATIENT PHYSICAL THERAPY TREATMENT   Patient Name: Crystal Rhodes MRN: [id number] DOB:July 12, 2000, 23 y.o., female Today's Date: 02/03/2024  END OF SESSION:  PT End of Session - 02/03/24 1025     Visit Number 2    Date for Recertification  04/18/24    Authorization Type U[employer]    PT Start Time 1012    PT Stop Time 1053    PT Time Calculation (min) 41 min    Behavior During Therapy [facility] for tasks assessed/performed          Past Medical History:  Diagnosis Date   Anemia    Angio-edema    Eczema    Fibroids    STD (sexually transmitted disease)    chlamydia treated   Urticaria    Past Surgical History:  Procedure Laterality Date   BREAST REDUCTION SURGERY  03/2022   EYE SURGERY Bilateral 2006   Patient Active Problem List   Diagnosis Date Noted   Cholelithiasis during pregnancy 01/08/2024   Fibroid uterus 12/14/2023   Marginal insertion of umbilical cord affecting management of mother in second trimester 12/04/2023   Alpha thalassemia trait 10/23/2023   Vitamin D  deficiency 09/25/2023   Rubella non-immune status, antepartum 09/25/2023   Supervision of low-risk pregnancy 09/01/2023   Asthma 08/20/2023   Frequent headaches 07/03/2023   History of iron  deficiency anemia 12/12/2022   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 09/23/2022    PCP: Gerome Brunet  REFERRING PROVIDER: Vannie Cornell SAUNDERS, CNM   REFERRING DIAG: [phone] (ICD-10-CM) - Pelvic pain affecting pregnancy in second trimester, antepartum   THERAPY DIAG:  Cramp and spasm  Pelvic pain affecting pregnancy in second trimester, antepartum  Rationale for Evaluation and Treatment: Rehabilitation  ONSET DATE: 07/2023  SUBJECTIVE:                                                                                                                                                                                           SUBJECTIVE STATEMENT: Pt reports minimal pain this morning, since  it is early and she just woke up.  Pt reports she is not afraid of water and knows basic swimming skills. Pt is [redacted]wks pregnant.    Initial subjective: due 04/17/24. Patient reports pelvic pain since she was pregnant and as the pernancy has increased. Tried a belly band and does not help. Try to do Kegels but not able to .  Fluid intake: water, juice  FUNCTIONAL LIMITATIONS: movements  PERTINENT HISTORY:  Medications for current condition: none Surgeries: see above Other: [place] Sexual abuse: No  PAIN:  Are you having pain? Yes NPRS scale:  2/10 Pain location: pelvic area  Pain type: dull and worse pain is deep ache; random pain that are sharp and shooting, spasms 2 x when using the bathroom Pain description: intermittent   Aggravating factors: excessive movement Relieving factors: wait the pain out; when getting out of a chair has to press on the sacrum  PRECAUTIONS: Other: pregnant  RED FLAGS: None   WEIGHT BEARING RESTRICTIONS: No  FALLS:  Has patient fallen in last 6 months? No  OCCUPATION: CMA  ACTIVITY LEVEL : low  PLOF: Independent  PATIENT GOALS: reduce pain   BOWEL MOVEMENT: No issues   URINATION: Pain with urination: No Fully empty bladder: Yes:                                           Post-void dribble: No Stream: Strong Urgency: Yes , due to pregnancy Frequency:during the day 3 hours                                                        Nocturia: No   Leakage: none Pads/briefs: No  INTERCOURSE:not having intercourse due to pregnancy.    PREGNANCY: Vaginal deliveries 0 C-section deliveries 0 Currently pregnant Yes: due 04/15/24  PROLAPSE: None   OBJECTIVE:  Note: Objective measures were completed at Evaluation unless otherwise noted.   PATIENT SURVEYS:  PFIQ-7: 24 POPIQ-7 24  COGNITION: Overall cognitive status: Within functional limits for tasks assessed      FUNCTIONAL TESTS:  Single leg stance:  Rt:no hip drop  Lt:no  hip drop Squat:no difficulties Bed mobility:   LUMBARAROM/PROM:  A/PROM A/PROM  Eval (% available)  Flexion full  Extension NT due to pregnancy  Right lateral flexion 75  Left lateral flexion 100  Right rotation 75  Left rotation 100   (Blank rows = not tested)  LOWER EXTREMITY ROM: bilateral hip ROM is full   LOWER EXTREMITY MMT: bilateral hip strength is 4/5  PALPATION:  General: tenderness located on the left suprapubic area, tightness in the left hip adductors; decreased movement of the L4-S 1 area  Pelvic Alignment: Left ilium is posteriorly rotated, tenderness located on the left SI joint                         TODAY'S TREATMENT:                                                                                                                              DATE: [facility] Adult PT Treatment:  Date: 02/03/24 Pt seen for aquatic therapy today.  Treatment took place in water 3.5-4.75 ft in depth at the [facility] pool. Temp of water was 91.  Pt entered/exited the pool via stairs independently with bil rail.  - Intro to aquatic therapy principles - unsupported walking forward/ backward multiple laps, with reciprocal arm swing - side stepping with arm add/abdct -> with short hollow noodles - TrA set with short hollow noodle pull down to thighs x 10  - UE on wall:  toe/heel raises x10; hip add/abd x10 ; hip flexion /extension x10 each; - high knee marching backward/forward  - STS from 3rd step with forward arm reach, controlled descent, neutral head - straddling noodle with UE support on wall: cycling, hip abdct/ add, hip flexion/extension  Pt requires the buoyancy and hydrostatic pressure of water for support, and to offload joints by unweighting joint load by at least 50 % in navel deep water and by at least 75-80% in chest to neck deep water.  Viscosity of the water is needed for resistance of strengthening. Water current  perturbations provides challenge to standing balance requiring increased core activation.       PATIENT EDUCATION:  01/25/24 Education details: Access Code: [email or web address] Person educated: Patient Education method: Explanation, Demonstration, Tactile cues, Verbal cues, and Handouts Education comprehension: verbalized understanding, returned demonstration, verbal cues required, tactile cues required, and needs further education  HOME EXERCISE PROGRAM: 01/25/24 Access Code: [email or web address] URL: [email or web address] Date: 01/25/2024 Prepared by: Channing Pereyra  Program Notes sit on chair with yoga block between knees and move thigh forward and back 10 x 1 time per day.   Exercises - Quadruped Rocking Slow  - 1 x daily - 7 x weekly - 1 sets - 10 reps - Cat Cow  - 1 x daily - 7 x weekly - 1 sets - 10 reps  ASSESSMENT:  CLINICAL IMPRESSION: Pt demonstrates safety and independence in aquatic setting with therapist instructing from deck. Pt is confident in setting, moving throughout all depths easily, though needs support of wall at greatest depth.  Good tolerance for all exercises performed.  Pt reported elimination of pain during beginning of session. Will continue to progress as tolerated.  Goals are ongoing.     Initial impression: Patient is a 23 y.o. female who was seen today for physical therapy evaluation and treatment for pelvic pain affecting pregnancy. Patient reports her pain began when she was pregnant 6/25. Her pain level is 2-9/10 and comes on with activity or during urination. She has tenderness located on the left SI joint and superior   pubic bone on left.  Decreased movement of L4-S1. he has tightness along the left hip adductors. Left ilium is rotated posteriorly.  She has to hold her sacrum when going from sit to stand due to pain. She will benefit from skilled therapy to reduce her r pain and improve her function as her pregnancy progresses.   OBJECTIVE IMPAIRMENTS:  decreased activity tolerance, increased muscle spasms, and pain.   ACTIVITY LIMITATIONS: sitting, standing, squatting, and locomotion level  PARTICIPATION LIMITATIONS: community activity and occupation  PERSONAL FACTORS: Time since onset of injury/illness/exacerbation are also affecting patient's functional outcome.   REHAB POTENTIAL: Excellent  CLINICAL DECISION MAKING: Stable/uncomplicated  EVALUATION COMPLEXITY: Low   GOALS: Goals reviewed with patient? Yes  SHORT TERM GOALS: Target date: 02/22/24  Patient independent with initial HEP for flexibility and strength of SI and lumbar spine.  Baseline: Goal status: INITIAL  2.  Patient is  able to go from sit to stand and not hold onto the sacrum 2 out of 10 times due to increased in stability.  Baseline:  Goal status: INITIAL   LONG TERM GOALS: Target date: 04/18/24  Patient is independent with advanced HEP for lumbar SI stability for work and home activities to reduce occurrence of pain .  Baseline:  Goal status: INITIAL  2.  Patient is able to go from sit to stand without holding onto her sacrum 5 out of 10 times due to increased in stability.  Baseline:  Goal status: INITIAL  3.  Patient  is able to work with her pain level </= 4/10 due to increased in strength and understanding how to manage her pain.  Baseline:  Goal status: INITIAL  4.  Patient educated on ways to move the baby through the different stages of the pelvis to prepare for birth and reduce increase in back pain.  Baseline:  Goal status: INITIAL   PLAN:  PT FREQUENCY: 2x/week  PT DURATION: 12 weeks  PLANNED INTERVENTIONS: 97110-Therapeutic exercises, 97530- Therapeutic activity, 97112- Neuromuscular re-education, 97535- Self Care, [id number]- Manual therapy, [phone]- Aquatic Therapy, Patient/Family education, Joint mobilization, Spinal mobilization, Cryotherapy, Moist heat, and Biofeedback  PLAN FOR NEXT SESSION: Aquatics- exercises for SI And lumbar  stability; Pelvic SI mobility exercises, mobilize the lumbar spine, check SI alignment, quadruped with yoga block, off centered exercises  Delon Aquas, PTA 02/03/2024 12:11 PM [facility] [address] [place], [place], [address] Phone: [phone]   Fax:  [phone]  "

## 2024-02-05 ENCOUNTER — Ambulatory Visit (HOSPITAL_BASED_OUTPATIENT_CLINIC_OR_DEPARTMENT_OTHER): Payer: Self-pay | Admitting: Physical Therapy

## 2024-02-08 ENCOUNTER — Other Ambulatory Visit (HOSPITAL_COMMUNITY)
Admission: RE | Admit: 2024-02-08 | Discharge: 2024-02-08 | Disposition: A | Discharge status: Home / Self Care | Source: Ambulatory Visit | Attending: Certified Nurse Midwife | Admitting: Certified Nurse Midwife

## 2024-02-08 ENCOUNTER — Other Ambulatory Visit: Payer: Self-pay

## 2024-02-08 DIAGNOSIS — O23599 Infection of other part of genital tract in pregnancy, unspecified trimester: Secondary | ICD-10-CM

## 2024-02-09 ENCOUNTER — Ambulatory Visit: Payer: Self-pay | Admitting: Certified Nurse Midwife

## 2024-02-09 ENCOUNTER — Encounter: Admitting: Physical Therapy

## 2024-02-09 ENCOUNTER — Encounter: Payer: Self-pay | Admitting: Physical Therapy

## 2024-02-09 DIAGNOSIS — R102 Pelvic and perineal pain unspecified side: Secondary | ICD-10-CM | POA: Insufficient documentation

## 2024-02-09 DIAGNOSIS — R252 Cramp and spasm: Secondary | ICD-10-CM | POA: Diagnosis present

## 2024-02-09 DIAGNOSIS — B3731 Acute candidiasis of vulva and vagina: Secondary | ICD-10-CM

## 2024-02-09 DIAGNOSIS — O26892 Other specified pregnancy related conditions, second trimester: Secondary | ICD-10-CM | POA: Diagnosis present

## 2024-02-09 LAB — CERVICOVAGINAL ANCILLARY ONLY
Bacterial Vaginitis (gardnerella): NEGATIVE
Candida Glabrata: NEGATIVE
Candida Vaginitis: POSITIVE — AB
Comment: NEGATIVE
Comment: NEGATIVE
Comment: NEGATIVE

## 2024-02-09 MED ORDER — CLOTRIMAZOLE 1 % EX SOLN: 1.0000 | Freq: Two times a day (BID) | CUTANEOUS | 0 refills | Status: AC

## 2024-02-09 MED ORDER — FLUCONAZOLE 150 MG PO TABS: 150.0000 mg | ORAL_TABLET | Freq: Every day | ORAL | 1 refills | Status: DC

## 2024-02-09 NOTE — Therapy (Signed)
 " OUTPATIENT PHYSICAL THERAPY TREATMENT   Patient Name: Crystal Rhodes MRN: [id number] DOB:Feb 03, 2001, 23 y.o., female Today's Date: 02/09/2024  END OF SESSION:  PT End of Session - 02/09/24 1607     Visit Number 3    Date for Recertification  04/18/24    Authorization Type UHC    PT Start Time 1605    PT Stop Time 1650    PT Time Calculation (min) 45 min    Activity Tolerance Patient tolerated treatment well    Behavior During Therapy WFL for tasks assessed/performed          Past Medical History:  Diagnosis Date   Anemia    Angio-edema    Eczema    Fibroids    STD (sexually transmitted disease)    chlamydia treated   Urticaria    Past Surgical History:  Procedure Laterality Date   BREAST REDUCTION SURGERY  03/2022   EYE SURGERY Bilateral 2006   Patient Active Problem List   Diagnosis Date Noted   Cholelithiasis during pregnancy 01/08/2024   Fibroid uterus 12/14/2023   Marginal insertion of umbilical cord affecting management of mother in second trimester 12/04/2023   Alpha thalassemia trait 10/23/2023   Vitamin D  deficiency 09/25/2023   Rubella non-immune status, antepartum 09/25/2023   Supervision of low-risk pregnancy 09/01/2023   Asthma 08/20/2023   Frequent headaches 07/03/2023   History of iron  deficiency anemia 12/12/2022   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 09/23/2022    PCP: Gerome Brunet  REFERRING PROVIDER: Vannie Cornell SAUNDERS, CNM   REFERRING DIAG: [phone] (ICD-10-CM) - Pelvic pain affecting pregnancy in second trimester, antepartum   THERAPY DIAG:  Cramp and spasm  Pelvic pain affecting pregnancy in second trimester, antepartum  Rationale for Evaluation and Treatment: Rehabilitation  ONSET DATE: 07/2023  SUBJECTIVE:                                                                                                                                                                                           SUBJECTIVE  STATEMENT: I am not able to do the aquatics due to the yeast infection and irritation to the skin after being in the pool.   Initial subjective: due 04/17/24. Patient reports pelvic pain since she was pregnant and as the pernancy has increased. Tried a belly band and does not help. Try to do Kegels but not able to .  Fluid intake: water, juice  FUNCTIONAL LIMITATIONS: movements  PERTINENT HISTORY:  Medications for current condition: none Surgeries: see above Other: [place] Sexual abuse: No  PAIN:  Are you having pain? Yes NPRS scale: 2/10 02/09/24;  Pain level 2/10 Pain location: pelvic area  Pain type: dull and worse pain is deep ache; random pain that are sharp and shooting, spasms 2 x when using the bathroom Pain description: intermittent   Aggravating factors: excessive movement Relieving factors: wait the pain out; when getting out of a chair has to press on the sacrum  PRECAUTIONS: Other: pregnant  RED FLAGS: None   WEIGHT BEARING RESTRICTIONS: No  FALLS:  Has patient fallen in last 6 months? No  OCCUPATION: CMA  ACTIVITY LEVEL : low  PLOF: Independent  PATIENT GOALS: reduce pain   BOWEL MOVEMENT: No issues   URINATION: Pain with urination: No Fully empty bladder: Yes:                                           Post-void dribble: No Stream: Strong Urgency: Yes , due to pregnancy Frequency:during the day 3 hours                                                        Nocturia: No   Leakage: none Pads/briefs: No  INTERCOURSE:not having intercourse due to pregnancy.    PREGNANCY: Vaginal deliveries 0 C-section deliveries 0 Currently pregnant Yes: due 04/15/24  PROLAPSE: None   OBJECTIVE:  Note: Objective measures were completed at Evaluation unless otherwise noted.   PATIENT SURVEYS:  PFIQ-7: 24 POPIQ-7 24  COGNITION: Overall cognitive status: Within functional limits for tasks assessed      FUNCTIONAL TESTS:  Single leg  stance:  Rt:no hip drop  Lt:no hip drop Squat:no difficulties Bed mobility:   LUMBARAROM/PROM:  A/PROM A/PROM  Eval (% available)  Flexion full  Extension NT due to pregnancy  Right lateral flexion 75  Left lateral flexion 100  Right rotation 75  Left rotation 100   (Blank rows = not tested)  LOWER EXTREMITY ROM: bilateral hip ROM is full   LOWER EXTREMITY MMT: bilateral hip strength is 4/5  PALPATION:  General: tenderness located on the left suprapubic area, tightness in the left hip adductors; decreased movement of the L4-S 1 area  Pelvic Alignment: Left ilium is posteriorly rotated, tenderness located on the left SI joint 02/09/24: ASIS are equal and left ilium is shallow                         TODAY'S TREATMENT: 02/09/24 Manual: Soft tissue mobilization: Manual work to the left lumbar paraspinals, quadratus, and SI area while laying on side Kinesio tape Kinesio tape on the lower abdomen to support her abdomen to reduce strain on the lumbar and tailbone Neuromuscular re-education: Core retraining: Core facilitation: Quadruped lift opposite arm and leg 10 x each side Standing staggered reaching  Form correction: Laying on side with therapist gloved finger on the anococcygeal ligament with pelvic rock then therapist fingers on the coccygeus muscle with pelvic diagonal movements Down training: Diaphragmatic breathing to relax the pelvic floor Self-care: Educated patient on how to use a tennis ball to massage the lumbar or pelvic floor to reduce tension Educated patient on how to place the kinesio tape on her abdomen, how long to stay on and to check her skin  [facility] Adult PT Treatment:                                             Date: 02/03/24 Pt seen for aquatic therapy today.  Treatment took place in water 3.5-4.75 ft in depth  at the [facility] pool. Temp of water was 91.  Pt entered/exited the pool via stairs independently with bil rail.  - Intro to aquatic therapy principles - unsupported walking forward/ backward multiple laps, with reciprocal arm swing - side stepping with arm add/abdct -> with short hollow noodles - TrA set with short hollow noodle pull down to thighs x 10  - UE on wall:  toe/heel raises x10; hip add/abd x10 ; hip flexion /extension x10 each; - high knee marching backward/forward  - STS from 3rd step with forward arm reach, controlled descent, neutral head - straddling noodle with UE support on wall: cycling, hip abdct/ add, hip flexion/extension  Pt requires the buoyancy and hydrostatic pressure of water for support, and to offload joints by unweighting joint load by at least 50 % in navel deep water and by at least 75-80% in chest to neck deep water.  Viscosity of the water is needed for resistance of strengthening. Water current perturbations provides challenge to standing balance requiring increased core activation.       PATIENT EDUCATION:  01/25/24 Education details: Access Code: [email or web address] Person educated: Patient Education method: Explanation, Demonstration, Tactile cues, Verbal cues, and Handouts Education comprehension: verbalized understanding, returned demonstration, verbal cues required, tactile cues required, and needs further education  HOME EXERCISE PROGRAM: 01/25/24 Access Code: [email or web address] URL: [email or web address] Date: 01/25/2024 Prepared by: Channing Pereyra  Program Notes sit on chair with yoga block between knees and move thigh forward and back 10 x 1 time per day.   Exercises - Quadruped Rocking Slow  - 1 x daily - 7 x weekly - 1 sets - 10 reps - Cat Cow  - 1 x daily - 7 x weekly - 1 sets - 10 reps  ASSESSMENT:  CLINICAL IMPRESSION: Patient is a 23 y.o. female who was seen today for physical therapy  treatment for pelvic pain affecting  pregnancy. Patient was able to feel her pelvic floor relax with diaphragmatic breathing. She had trigger points in the anococcygeal ligament and coccygeus.  Her pelvis was balanced after therapy. She is understanding ways to manage her pain. She will benefit from skilled therapy to reduce her r pain and improve her function as her pregnancy progresses.   OBJECTIVE IMPAIRMENTS: decreased activity tolerance, increased muscle spasms, and pain.   ACTIVITY LIMITATIONS: sitting, standing, squatting, and locomotion level  PARTICIPATION LIMITATIONS: community activity and occupation  PERSONAL FACTORS: Time since onset of injury/illness/exacerbation are also affecting patient's functional outcome.   REHAB POTENTIAL: Excellent  CLINICAL DECISION MAKING: Stable/uncomplicated  EVALUATION COMPLEXITY: Low   GOALS: Goals reviewed with patient? Yes  SHORT TERM GOALS: Target date: 02/22/24  Patient independent with initial HEP for flexibility and strength of SI and lumbar spine.  Baseline: Goal status: INITIAL  2.  Patient is able to go from sit to stand and not hold onto the sacrum 2 out of 10 times due to increased in stability.  Baseline:  Goal status: INITIAL   LONG TERM GOALS: Target date: 04/18/24  Patient is independent with advanced HEP for lumbar SI stability for work and home activities  to reduce occurrence of pain .  Baseline:  Goal status: INITIAL  2.  Patient is able to go from sit to stand without holding onto her sacrum 5 out of 10 times due to increased in stability.  Baseline:  Goal status: INITIAL  3.  Patient  is able to work with her pain level </= 4/10 due to increased in strength and understanding how to manage her pain.  Baseline:  Goal status: INITIAL  4.  Patient educated on ways to move the baby through the different stages of the pelvis to prepare for birth and reduce increase in back pain.  Baseline:  Goal status: INITIAL   PLAN:  PT FREQUENCY:  2x/week  PT DURATION: 12 weeks  PLANNED INTERVENTIONS: 97110-Therapeutic exercises, 97530- Therapeutic activity, 97112- Neuromuscular re-education, 97535- Self Care, 02859- Manual therapy, [phone]- Aquatic Therapy, Patient/Family education, Joint mobilization, Spinal mobilization, Cryotherapy, Moist heat, and Biofeedback  PLAN FOR NEXT SESSION:  Pelvic SI mobility exercises, see if tape is helping her,  check SI alignment,  off centered exercises  Channing Pereyra, PT 02/09/2024 5:00 PM  "

## 2024-02-10 ENCOUNTER — Ambulatory Visit (INDEPENDENT_AMBULATORY_CARE_PROVIDER_SITE_OTHER): Payer: Self-pay | Admitting: Certified Nurse Midwife

## 2024-02-10 ENCOUNTER — Encounter: Payer: Self-pay | Admitting: Certified Nurse Midwife

## 2024-02-10 VITALS — BP 134/83 | HR 106 | Wt 186.6 lb

## 2024-02-10 DIAGNOSIS — Z3493 Encounter for supervision of normal pregnancy, unspecified, third trimester: Secondary | ICD-10-CM

## 2024-02-10 DIAGNOSIS — B3731 Acute candidiasis of vulva and vagina: Secondary | ICD-10-CM

## 2024-02-10 DIAGNOSIS — R002 Palpitations: Secondary | ICD-10-CM

## 2024-02-10 DIAGNOSIS — O26893 Other specified pregnancy related conditions, third trimester: Secondary | ICD-10-CM | POA: Diagnosis not present

## 2024-02-10 DIAGNOSIS — Z23 Encounter for immunization: Secondary | ICD-10-CM

## 2024-02-10 DIAGNOSIS — R12 Heartburn: Secondary | ICD-10-CM

## 2024-02-10 DIAGNOSIS — O26613 Liver and biliary tract disorders in pregnancy, third trimester: Secondary | ICD-10-CM

## 2024-02-10 DIAGNOSIS — K8070 Calculus of gallbladder and bile duct without cholecystitis without obstruction: Secondary | ICD-10-CM

## 2024-02-10 DIAGNOSIS — Z3A30 30 weeks gestation of pregnancy: Secondary | ICD-10-CM | POA: Diagnosis not present

## 2024-02-10 DIAGNOSIS — R102 Pelvic and perineal pain unspecified side: Secondary | ICD-10-CM | POA: Diagnosis not present

## 2024-02-10 MED ORDER — OMEPRAZOLE 40 MG PO CPDR: 40.0000 mg | DELAYED_RELEASE_CAPSULE | Freq: Every day | ORAL | 3 refills | Status: AC

## 2024-02-10 NOTE — Progress Notes (Signed)
 "  PRENATAL VISIT NOTE  Subjective:  Crystal Rhodes is a 23 y.o. G2P0010 at [redacted]w[redacted]d being seen today for ongoing prenatal care.  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  Patient reports heartburn and vaginal irritation.  Contractions: Not present. Vag. Bleeding: None.  Movement: Present. Denies leaking of fluid.   The following portions of the patient's history were reviewed and updated as appropriate: allergies, current medications, past family history, past medical history, past social history, past surgical history and problem list.   Objective:   Vitals:   02/10/24 0827  BP: 134/83  Pulse: (!) 106  Weight: 186 lb 9.6 oz (84.6 kg)    Fetal Status:  Fetal Heart Rate (bpm): 142 Fundal Height: 31 cm Movement: Present    General: Alert, oriented and cooperative. Patient is in no acute distress.  Skin: Skin is warm and dry. No rash noted.   Cardiovascular: Normal heart rate noted  Respiratory: Normal respiratory effort, no problems with respiration noted  Abdomen: Soft, gravid, appropriate for gestational age.  Pain/Pressure: Present     Pelvic: Cervical exam deferred        Extremities: Normal range of motion.  Edema: None  Mental Status: Normal mood and affect. Normal behavior. Normal judgment and thought content.      09/30/2023   12:12 PM 12/25/2022    9:28 AM  Depression screen PHQ 2/9  Decreased Interest 0 0  Down, Depressed, Hopeless 0 0  PHQ - 2 Score 0 0  Altered sleeping 0   Tired, decreased energy 1   Change in appetite 0    Feeling bad or failure about yourself  0   Trouble concentrating 0   Moving slowly or fidgety/restless 0   Suicidal thoughts 0   PHQ-9 Score 1       Data saved with a previous flowsheet row definition        09/30/2023   12:15 PM  GAD 7 : Generalized Anxiety Score  Nervous, Anxious, on Edge 0  Control/stop worrying 1  Worry too much - different things 1  Trouble relaxing 0  Restless 0  Easily annoyed or irritable 3  Afraid - awful might happen 0  Total GAD 7 Score 5    Assessment and Plan:  Pregnancy: G2P0010 at [redacted]w[redacted]d 1. Encounter for supervision of low-risk pregnancy in third trimester (Primary) - Doing well, feeling regular and vigorous fetal movement   2. [redacted] weeks gestation of pregnancy - Routine OB care  - Tdap vaccine greater than or equal to 7yo IM  3. Cholelithiasis during pregnancy - Stable, no additional episodes  4. Yeast vaginitis - Swab was positive, sent both external and oral treatments for a candida rash on the inside of her thigh  5. Heartburn during pregnancy in third trimester - Omeprazole 40mg  sent to pharmacy  6. Pelvic pain affecting pregnancy in third trimester, antepartum - Seeing PFPT, feeling better with physical therapy.   7. Heart palpitations - Has happened twice, both times at work in the am after eating. She suddenly feels a little light headed, sits down and heart races/ she gets hot and then it passes. -  Reviewed GTT results which showed a bump after sugar with reflexive hypoglycemia, advised to increase protein in the am (prior to carbs/caffeine ) and if it happens again we will refer to [facility].  Preterm labor symptoms and general obstetric precautions including but not limited to vaginal bleeding, contractions, leaking of fluid and fetal movement were reviewed in detail with the patient. Please refer to After Visit Summary for other counseling recommendations.   Return in about 2 weeks (around 02/24/2024) for IN-PERSON,  LOB.  Future Appointments  Date Time Provider Department Center  02/16/2024  4:00 PM Elnor Channing FALCON, [place] [facility]  02/23/2024 10:30 AM Elnor Channing FALCON, PT [facility]  03/01/2024 10:30 AM Elnor Channing FALCON, PT [facility]  03/10/2024  8:15 AM [facility] PROVIDER 1 [facility]  03/10/2024  8:30 AM [facility] US4 [facility]  03/10/2024  4:00 PM Elnor Channing FALCON, PT [facility]  03/15/2024  9:30 AM Elnor Channing FALCON, PT [facility]  03/22/2024  9:30 AM Elnor Channing FALCON, PT [facility]  04/12/2024  8:30 AM Elnor Channing FALCON, PT [facility]    Cornell JONELLE Finder, CNM "

## 2024-02-16 ENCOUNTER — Encounter: Payer: Self-pay | Admitting: Physical Therapy

## 2024-02-16 ENCOUNTER — Encounter: Payer: Self-pay | Attending: Certified Nurse Midwife | Admitting: Physical Therapy

## 2024-02-16 DIAGNOSIS — R102 Pelvic and perineal pain unspecified side: Secondary | ICD-10-CM | POA: Diagnosis present

## 2024-02-16 DIAGNOSIS — R252 Cramp and spasm: Secondary | ICD-10-CM | POA: Diagnosis not present

## 2024-02-16 DIAGNOSIS — O26893 Other specified pregnancy related conditions, third trimester: Secondary | ICD-10-CM | POA: Insufficient documentation

## 2024-02-16 DIAGNOSIS — Z3A00 Weeks of gestation of pregnancy not specified: Secondary | ICD-10-CM | POA: Insufficient documentation

## 2024-02-16 LAB — POCT HEMOGLOBIN-HEMACUE: Hemoglobin: 9.8 g/dL — ABNORMAL LOW (ref 12.0–15.0)

## 2024-02-16 NOTE — Therapy (Signed)
 " OUTPATIENT PHYSICAL THERAPY TREATMENT   Patient Name: Crystal Rhodes MRN: [id number] DOB:2000/02/15, 24 y.o., female Today's Date: 02/16/2024  END OF SESSION:  PT End of Session - 02/16/24 1606     Visit Number 4    Date for Recertification  04/18/24    Authorization Type [employer]    PT Start Time 1606    PT Stop Time 1650    PT Time Calculation (min) 44 min    Activity Tolerance Patient tolerated treatment well    Behavior During Therapy WFL for tasks assessed/performed          Past Medical History:  Diagnosis Date   Anemia    Angio-edema    Eczema    Fibroids    STD (sexually transmitted disease)    chlamydia treated   Urticaria    Past Surgical History:  Procedure Laterality Date   BREAST REDUCTION SURGERY  03/2022   EYE SURGERY Bilateral 2006   Patient Active Problem List   Diagnosis Date Noted   Cholelithiasis during pregnancy 01/08/2024   Fibroid uterus 12/14/2023   Marginal insertion of umbilical cord affecting management of mother in second trimester 12/04/2023   Alpha thalassemia trait 10/23/2023   Vitamin D  deficiency 09/25/2023   Rubella non-immune status, antepartum 09/25/2023   Supervision of low-risk pregnancy 09/01/2023   Asthma 08/20/2023   Frequent headaches 07/03/2023   History of iron  deficiency anemia 12/12/2022   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 09/23/2022    PCP: Gerome Brunet  REFERRING PROVIDER: Vannie Cornell SAUNDERS, CNM   REFERRING DIAG: [phone] (ICD-10-CM) - Pelvic pain affecting pregnancy in second trimester, antepartum   THERAPY DIAG:  Cramp and spasm  Pelvic pain affecting pregnancy in second trimester, antepartum  Rationale for Evaluation and Treatment: Rehabilitation  ONSET DATE: 07/2023  SUBJECTIVE:                                                                                                                                                                                           SUBJECTIVE  STATEMENT: I am allergic to the tape so not able to use it. I was having some back pain today. Pelvic pain when bringing leg off the bed in the morning.  Initial subjective: due 04/17/24. Patient reports pelvic pain since she was pregnant and as the pernancy has increased. Tried a belly band and does not help. Try to do Kegels but not able to .  Fluid intake: water, juice  FUNCTIONAL LIMITATIONS: movements  PERTINENT HISTORY:  Medications for current condition: none Surgeries: see above Other: [place] Sexual abuse: No  PAIN:  Are you having  pain? Yes NPRS scale: 2/10 02/09/24; Pain level 2/10 Pain location: pelvic area  Pain type: dull and worse pain is deep ache; random pain that are sharp and shooting, spasms 2 x when using the bathroom Pain description: intermittent   Aggravating factors: excessive movement Relieving factors: wait the pain out; when getting out of a chair has to press on the sacrum  PRECAUTIONS: Other: pregnant  RED FLAGS: None   WEIGHT BEARING RESTRICTIONS: No  FALLS:  Has patient fallen in last 6 months? No  OCCUPATION: CMA  ACTIVITY LEVEL : low  PLOF: Independent  PATIENT GOALS: reduce pain   BOWEL MOVEMENT: No issues   URINATION: Pain with urination: No Fully empty bladder: Yes:                                           Post-void dribble: No Stream: Strong Urgency: Yes , due to pregnancy Frequency:during the day 3 hours                                                        Nocturia: No   Leakage: none Pads/briefs: No  INTERCOURSE:not having intercourse due to pregnancy.    PREGNANCY: Vaginal deliveries 0 C-section deliveries 0 Currently pregnant Yes: due 04/15/24  PROLAPSE: None   OBJECTIVE:  Note: Objective measures were completed at Evaluation unless otherwise noted.   PATIENT SURVEYS:  PFIQ-7: 24 POPIQ-7 24  COGNITION: Overall cognitive status: Within functional limits for tasks assessed      FUNCTIONAL TESTS:   Single leg stance:  Rt:no hip drop  Lt:no hip drop Squat:no difficulties Bed mobility:   LUMBARAROM/PROM:  A/PROM A/PROM  Eval (% available)  Flexion full  Extension NT due to pregnancy  Right lateral flexion 75  Left lateral flexion 100  Right rotation 75  Left rotation 100   (Blank rows = not tested)  LOWER EXTREMITY ROM: bilateral hip ROM is full   LOWER EXTREMITY MMT: bilateral hip strength is 4/5  PALPATION:  General: tenderness located on the left suprapubic area, tightness in the left hip adductors; decreased movement of the L4-S 1 area  Pelvic Alignment: Left ilium is posteriorly rotated, tenderness located on the left SI joint 02/09/24: ASIS are equal and left ilium is shallow                         TODAY'S TREATMENT: 02/16/24 Manual: Soft tissue mobilization: Laying on left side doing manual work to the lumbar and thoracic paraspinals and quadratus Myofascial release: Using the suction cup to the low thoracic and lumbar to reduce facial restrictions Exercises: Stretches/mobility: Karolynn pose hold 15 sec 5 times  1/2 kneel with posterior pelvic tilt and rotate to the leg that is forward 10 x each way Strengthening: Quadruped lift opposite arm and leg with tactile cues to prevent increased lumbar lordosis Laying on side hip abduction 10 x each side Standing bilateral shoulder extension with 10 # tube band Standing shoulder horizontal abduction with 10 # tube band Offset feet with reaching to foo forward with holding 3 # wt Therapeutic activities: Functional strengthening activities: Sit to supine and back with legs together to reduce strain on pubic bone  Sitting in car and getting in and out with not separating the legs apart.     02/09/24 Manual: Soft tissue mobilization: Manual work to the left lumbar paraspinals, quadratus, and SI area while laying on side Kinesio tape Kinesio tape on the lower abdomen to support her abdomen to reduce strain  on the lumbar and tailbone Neuromuscular re-education: Core retraining: Core facilitation: Quadruped lift opposite arm and leg 10 x each side Standing staggered reaching  Form correction: Laying on side with therapist gloved finger on the anococcygeal ligament with pelvic rock then therapist fingers on the coccygeus muscle with pelvic diagonal movements Down training: Diaphragmatic breathing to relax the pelvic floor Self-care: Educated patient on how to use a tennis ball to massage the lumbar or pelvic floor to reduce tension Educated patient on how to place the kinesio tape on her abdomen, how long to stay on and to check her skin                                                                                                                                 DATE: [facility] Adult PT Treatment:                                             Date: 02/03/24 Pt seen for aquatic therapy today.  Treatment took place in water 3.5-4.75 ft in depth at the [facility] pool. Temp of water was 91.  Pt entered/exited the pool via stairs independently with bil rail.  - Intro to aquatic therapy principles - unsupported walking forward/ backward multiple laps, with reciprocal arm swing - side stepping with arm add/abdct -> with short hollow noodles - TrA set with short hollow noodle pull down to thighs x 10  - UE on wall:  toe/heel raises x10; hip add/abd x10 ; hip flexion /extension x10 each; - high knee marching backward/forward  - STS from 3rd step with forward arm reach, controlled descent, neutral head - straddling noodle with UE support on wall: cycling, hip abdct/ add, hip flexion/extension  Pt requires the buoyancy and hydrostatic pressure of water for support, and to offload joints by unweighting joint load by at least 50 % in navel deep water and by at least 75-80% in chest to neck deep water.  Viscosity of the water is needed for resistance of strengthening. Water current perturbations  provides challenge to standing balance requiring increased core activation.       PATIENT EDUCATION:  01/25/24 Education details: Access Code: [email or web address] Person educated: Patient Education method: Explanation, Demonstration, Tactile cues, Verbal cues, and Handouts Education comprehension: verbalized understanding, returned demonstration, verbal cues required, tactile cues required, and needs further education  HOME EXERCISE PROGRAM: 01/25/24 Access Code: [email or web address] URL: [email or web address] Date: 01/25/2024 Prepared by: Channing Pereyra  Program Notes sit on chair with yoga block between  knees and move thigh forward and back 10 x 1 time per day.   Exercises - Quadruped Rocking Slow  - 1 x daily - 7 x weekly - 1 sets - 10 reps - Cat Cow  - 1 x daily - 7 x weekly - 1 sets - 10 reps  ASSESSMENT:  CLINICAL IMPRESSION: Patient is a 24 y.o. female who was seen today for physical therapy  treatment for pelvic pain affecting pregnancy. Patient understand to do transfers with her legs together to reduce her pubic bone pain. She is not able to use the tape on her abdomen due to being allergic. She had no back pain after therapy. She is work on not increasing her thoracici lumbar junction extension and instead flatten the area more. She is working on her back strength. She will benefit from skilled therapy to reduce her  pain and improve her function as her pregnancy progresses.   OBJECTIVE IMPAIRMENTS: decreased activity tolerance, increased muscle spasms, and pain.   ACTIVITY LIMITATIONS: sitting, standing, squatting, and locomotion level  PARTICIPATION LIMITATIONS: community activity and occupation  PERSONAL FACTORS: Time since onset of injury/illness/exacerbation are also affecting patient's functional outcome.   REHAB POTENTIAL: Excellent  CLINICAL DECISION MAKING: Stable/uncomplicated  EVALUATION COMPLEXITY: Low   GOALS: Goals reviewed with patient? Yes  SHORT TERM  GOALS: Target date: 02/22/24  Patient independent with initial HEP for flexibility and strength of SI and lumbar spine.  Baseline: Goal status: Met 02/16/24  2.  Patient is able to go from sit to stand and not hold onto the sacrum 2 out of 10 times due to increased in stability.  Baseline:  Goal status: INITIAL   LONG TERM GOALS: Target date: 04/18/24  Patient is independent with advanced HEP for lumbar SI stability for work and home activities to reduce occurrence of pain .  Baseline:  Goal status: INITIAL  2.  Patient is able to go from sit to stand without holding onto her sacrum 5 out of 10 times due to increased in stability.  Baseline:  Goal status: INITIAL  3.  Patient  is able to work with her pain level </= 4/10 due to increased in strength and understanding how to manage her pain.  Baseline:  Goal status: INITIAL  4.  Patient educated on ways to move the baby through the different stages of the pelvis to prepare for birth and reduce increase in back pain.  Baseline:  Goal status: INITIAL   PLAN:  PT FREQUENCY: 2x/week  PT DURATION: 12 weeks  PLANNED INTERVENTIONS: 97110-Therapeutic exercises, 97530- Therapeutic activity, 97112- Neuromuscular re-education, 97535- Self Care, [id number]- Manual therapy, [phone]- Aquatic Therapy, Patient/Family education, Joint mobilization, Spinal mobilization, Cryotherapy, Moist heat, and Biofeedback  PLAN FOR NEXT SESSION:  Pelvic SI mobility exercises,   check SI alignment,  off centered exercises, see about sit to stand and if she holds her sacrum  Channing Pereyra, PT 02/16/2024 4:52 PM  "

## 2024-02-23 ENCOUNTER — Ambulatory Visit: Admitting: General Practice

## 2024-02-23 ENCOUNTER — Encounter: Payer: Self-pay | Admitting: Physical Therapy

## 2024-02-23 ENCOUNTER — Ambulatory Visit

## 2024-02-23 DIAGNOSIS — O368130 Decreased fetal movements, third trimester, not applicable or unspecified: Secondary | ICD-10-CM

## 2024-02-23 DIAGNOSIS — O26893 Other specified pregnancy related conditions, third trimester: Secondary | ICD-10-CM | POA: Diagnosis not present

## 2024-02-23 DIAGNOSIS — R252 Cramp and spasm: Secondary | ICD-10-CM

## 2024-02-23 DIAGNOSIS — Z3A32 32 weeks gestation of pregnancy: Secondary | ICD-10-CM

## 2024-02-23 DIAGNOSIS — O26892 Other specified pregnancy related conditions, second trimester: Secondary | ICD-10-CM

## 2024-02-23 NOTE — Progress Notes (Unsigned)
 Patient reports no fetal movement felt since 7:45 PM yesterday after episode of reflux and strong coughing. Patient placed on NST. Now feeling fetal movement. Non-reactive at 40 minutes with moderate variability and 10x10 accels present. 15x15 accels present before patient taken off of monitor.   Reviewed by Camie Rote, CNM, who recommends BPP out of an abundance of caution due to NST not reactive within 40 minutes. Care turned over to [facility] for BPP.  Vernell RN 02/23/24

## 2024-02-23 NOTE — Therapy (Signed)
 " OUTPATIENT PHYSICAL THERAPY TREATMENT   Patient Name: Crystal Rhodes MRN: [id number] DOB:03-Sep-2000, 24 y.o., female Today's Date: 02/23/2024  END OF SESSION:  PT End of Session - 02/23/24 1139     Visit Number 5    Date for Recertification  04/18/24    Authorization Type [employer]    PT Start Time 1138    PT Stop Time 1216    PT Time Calculation (min) 38 min    Activity Tolerance Patient tolerated treatment well    Behavior During Therapy WFL for tasks assessed/performed          Past Medical History:  Diagnosis Date   Anemia    Angio-edema    Eczema    Fibroids    STD (sexually transmitted disease)    chlamydia treated   Urticaria    Past Surgical History:  Procedure Laterality Date   BREAST REDUCTION SURGERY  03/2022   EYE SURGERY Bilateral 2006   Patient Active Problem List   Diagnosis Date Noted   Cholelithiasis during pregnancy 01/08/2024   Fibroid uterus 12/14/2023   Marginal insertion of umbilical cord affecting management of mother in second trimester 12/04/2023   Alpha thalassemia trait 10/23/2023   Vitamin D  deficiency 09/25/2023   Rubella non-immune status, antepartum 09/25/2023   Supervision of low-risk pregnancy 09/01/2023   Asthma 08/20/2023   Frequent headaches 07/03/2023   History of iron  deficiency anemia 12/12/2022   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 09/23/2022    PCP: Gerome Brunet  REFERRING PROVIDER: Vannie Cornell SAUNDERS, CNM   REFERRING DIAG: [phone] (ICD-10-CM) - Pelvic pain affecting pregnancy in second trimester, antepartum   THERAPY DIAG:  Cramp and spasm  Pelvic pain affecting pregnancy in second trimester, antepartum  Rationale for Evaluation and Treatment: Rehabilitation  ONSET DATE: 07/2023  SUBJECTIVE:                                                                                                                                                                                           SUBJECTIVE  STATEMENT: Not holding the sacrum to get up. I strained the left side with my acid reflux. No pain in the pubic bone area due to keeping legs together for transitional movements.  Initial subjective: due 04/17/24. Patient reports pelvic pain since she was pregnant and as the pernancy has increased. Tried a belly band and does not help. Try to do Kegels but not able to .  Fluid intake: water, juice  FUNCTIONAL LIMITATIONS: movements  PERTINENT HISTORY:  Medications for current condition: none Surgeries: see above Other: [place] Sexual abuse: No  PAIN:  Are you  having pain? Yes NPRS scale: 2/10 02/09/24; Pain level 2/10 Pain location: pelvic area  Pain type: dull and worse pain is deep ache; random pain that are sharp and shooting, spasms 2 x when using the bathroom Pain description: intermittent   Aggravating factors: excessive movement Relieving factors: wait the pain out; when getting out of a chair has to press on the sacrum  PRECAUTIONS: Other: pregnant  RED FLAGS: None   WEIGHT BEARING RESTRICTIONS: No  FALLS:  Has patient fallen in last 6 months? No  OCCUPATION: CMA  ACTIVITY LEVEL : low  PLOF: Independent  PATIENT GOALS: reduce pain   BOWEL MOVEMENT: No issues   URINATION: Pain with urination: No Fully empty bladder: Yes:                                           Post-void dribble: No Stream: Strong Urgency: Yes , due to pregnancy Frequency:during the day 3 hours                                                        Nocturia: No   Leakage: none Pads/briefs: No  INTERCOURSE:not having intercourse due to pregnancy.    PREGNANCY: Vaginal deliveries 0 C-section deliveries 0 Currently pregnant Yes: due 04/15/24  PROLAPSE: None   OBJECTIVE:  Note: Objective measures were completed at Evaluation unless otherwise noted.   PATIENT SURVEYS:  PFIQ-7: 24 POPIQ-7 24  COGNITION: Overall cognitive status: Within functional limits for tasks  assessed      FUNCTIONAL TESTS:  Single leg stance:  Rt:no hip drop  Lt:no hip drop Squat:no difficulties Bed mobility:   LUMBARAROM/PROM:  A/PROM A/PROM  Eval (% available)  Flexion full  Extension NT due to pregnancy  Right lateral flexion 75  Left lateral flexion 100  Right rotation 75  Left rotation 100   (Blank rows = not tested)  LOWER EXTREMITY ROM: bilateral hip ROM is full   LOWER EXTREMITY MMT: bilateral hip strength is 4/5  PALPATION:  General: tenderness located on the left suprapubic area, tightness in the left hip adductors; decreased movement of the L4-S 1 area  Pelvic Alignment: Left ilium is posteriorly rotated, tenderness located on the left SI joint 02/09/24: ASIS are equal and left ilium is shallow                         TODAY'S TREATMENT: 02/23/24 Manual: Soft tissue mobilization: Laying on side and therapist working on the left intercostals to reduce the spasms.   Exercises: Stretches/mobility: Karolynn pose hold 15 sec 5 times  Strengthening: Laying on side hip abduction 10 x 3 each side Quadruped lift opposite arm and leg with tactile cues to prevent increased lumbar lordosis 2 x 10 Standing with one leg ahead of the other bilateral shoulder extension 10 x each way  Squats with holding onto the yellow tube 10 x  Standing off set and push the yellow band forward to opposite side  Self-care: Educated patient on how to stretch her lateral rib cage in sitting when she gets a cramp in the intercostals    02/16/24 Manual: Soft tissue mobilization: Laying on left side doing manual work to the  lumbar and thoracic paraspinals and quadratus Myofascial release: Using the suction cup to the low thoracic and lumbar to reduce facial restrictions Exercises: Stretches/mobility: Karolynn pose hold 15 sec 5 times  1/2 kneel with posterior pelvic tilt and rotate to the leg that is forward 10 x each way Strengthening: Quadruped lift opposite arm and  leg with tactile cues to prevent increased lumbar lordosis Laying on side hip abduction 10 x each side Standing bilateral shoulder extension with 10 # tube band Standing shoulder horizontal abduction with 10 # tube band Offset feet with reaching to foo forward with holding 3 # wt Therapeutic activities: Functional strengthening activities: Sit to supine and back with legs together to reduce strain on pubic bone Sitting in car and getting in and out with not separating the legs apart.     02/09/24 Manual: Soft tissue mobilization: Manual work to the left lumbar paraspinals, quadratus, and SI area while laying on side Kinesio tape Kinesio tape on the lower abdomen to support her abdomen to reduce strain on the lumbar and tailbone Neuromuscular re-education: Core retraining: Core facilitation: Quadruped lift opposite arm and leg 10 x each side Standing staggered reaching  Form correction: Laying on side with therapist gloved finger on the anococcygeal ligament with pelvic rock then therapist fingers on the coccygeus muscle with pelvic diagonal movements Down training: Diaphragmatic breathing to relax the pelvic floor Self-care: Educated patient on how to use a tennis ball to massage the lumbar or pelvic floor to reduce tension Educated patient on how to place the kinesio tape on her abdomen, how long to stay on and to check her skin      PATIENT EDUCATION:  01/25/24 Education details: Access Code: [email or web address] Person educated: Patient Education method: Explanation, Demonstration, Tactile cues, Verbal cues, and Handouts Education comprehension: verbalized understanding, returned demonstration, verbal cues required, tactile cues required, and needs further education  HOME EXERCISE PROGRAM: 01/25/24 Access Code: [email or web address] URL: [email or web address] Date: 01/25/2024 Prepared by: Channing Pereyra  Program Notes sit on chair with yoga block between knees and move thigh  forward and back 10 x 1 time per day.   Exercises - Quadruped Rocking Slow  - 1 x daily - 7 x weekly - 1 sets - 10 reps - Cat Cow  - 1 x daily - 7 x weekly - 1 sets - 10 reps  ASSESSMENT:  CLINICAL IMPRESSION: Patient is a 24 y.o. female who was seen today for physical therapy  treatment for pelvic pain affecting pregnancy. Patient had a spam in her left intercostalis, muscles causing some discomfort. She understands how to stretch them. She has no pubic pain or back pain. Her exercises are helping her get stronger and stabilize the pelvis. She is learning how to progress the baby through the different stages in the pelvis. She will benefit from skilled therapy to reduce her  pain and improve her function as her pregnancy progresses.   OBJECTIVE IMPAIRMENTS: decreased activity tolerance, increased muscle spasms, and pain.   ACTIVITY LIMITATIONS: sitting, standing, squatting, and locomotion level  PARTICIPATION LIMITATIONS: community activity and occupation  PERSONAL FACTORS: Time since onset of injury/illness/exacerbation are also affecting patient's functional outcome.   REHAB POTENTIAL: Excellent  CLINICAL DECISION MAKING: Stable/uncomplicated  EVALUATION COMPLEXITY: Low   GOALS: Goals reviewed with patient? Yes  SHORT TERM GOALS: Target date: 02/22/24  Patient independent with initial HEP for flexibility and strength of SI and lumbar spine.  Baseline: Goal status: Met 02/16/24  2.  Patient is able to go  from sit to stand and not hold onto the sacrum 2 out of 10 times due to increased in stability.  Baseline:  Goal status: Met 02/23/24   LONG TERM GOALS: Target date: 04/18/24  Patient is independent with advanced HEP for lumbar SI stability for work and home activities to reduce occurrence of pain .  Baseline:  Goal status: INITIAL  2.  Patient is able to go from sit to stand without holding onto her sacrum 5 out of 10 times due to increased in stability.  Baseline:  Goal  status: INITIAL  3.  Patient  is able to work with her pain level </= 4/10 due to increased in strength and understanding how to manage her pain.  Baseline:  Goal status: INITIAL  4.  Patient educated on ways to move the baby through the different stages of the pelvis to prepare for birth and reduce increase in back pain.  Baseline:  Goal status: INITIAL   PLAN:  PT FREQUENCY: 2x/week  PT DURATION: 12 weeks  PLANNED INTERVENTIONS: 97110-Therapeutic exercises, 97530- Therapeutic activity, 97112- Neuromuscular re-education, 97535- Self Care, [id number]- Manual therapy, [phone]- Aquatic Therapy, Patient/Family education, Joint mobilization, Spinal mobilization, Cryotherapy, Moist heat, and Biofeedback  PLAN FOR NEXT SESSION:  Pelvic SI mobility exercises,    off centered exercises,   Channing Pereyra, PT 02/23/2024 2:00 PM  "

## 2024-02-23 NOTE — Progress Notes (Unsigned)
 BPP performed due to nonreactive NST within 40 minutes, though was later reactive. NST was performed for decreased fetal movement. BPP 8/8. Reassurance provided. Will follow up for regularly scheduled OB care.  Elenor DEL RN BSN 02/23/2024

## 2024-02-24 ENCOUNTER — Ambulatory Visit

## 2024-02-24 ENCOUNTER — Ambulatory Visit: Admitting: Certified Nurse Midwife

## 2024-02-24 VITALS — BP 139/85 | HR 110 | Wt 189.4 lb

## 2024-02-24 DIAGNOSIS — R55 Syncope and collapse: Secondary | ICD-10-CM

## 2024-02-24 DIAGNOSIS — Z3493 Encounter for supervision of normal pregnancy, unspecified, third trimester: Secondary | ICD-10-CM

## 2024-02-24 DIAGNOSIS — Z3A32 32 weeks gestation of pregnancy: Secondary | ICD-10-CM

## 2024-02-24 DIAGNOSIS — R42 Dizziness and giddiness: Secondary | ICD-10-CM

## 2024-02-25 ENCOUNTER — Ambulatory Visit (HOSPITAL_BASED_OUTPATIENT_CLINIC_OR_DEPARTMENT_OTHER): Admitting: Physical Therapy

## 2024-03-01 ENCOUNTER — Encounter: Payer: Self-pay | Admitting: Physical Therapy

## 2024-03-01 ENCOUNTER — Other Ambulatory Visit: Payer: Self-pay

## 2024-03-01 DIAGNOSIS — O26893 Other specified pregnancy related conditions, third trimester: Secondary | ICD-10-CM | POA: Diagnosis not present

## 2024-03-01 DIAGNOSIS — R252 Cramp and spasm: Secondary | ICD-10-CM

## 2024-03-01 DIAGNOSIS — O26892 Other specified pregnancy related conditions, second trimester: Secondary | ICD-10-CM

## 2024-03-01 DIAGNOSIS — B379 Candidiasis, unspecified: Secondary | ICD-10-CM

## 2024-03-01 MED ORDER — FLUCONAZOLE 150 MG PO TABS: 150.0000 mg | ORAL_TABLET | Freq: Once | ORAL | 0 refills | Status: AC

## 2024-03-01 NOTE — Therapy (Signed)
 " OUTPATIENT PHYSICAL THERAPY TREATMENT   Patient Name: Crystal Rhodes MRN: [id number] DOB:September 02, 2000, 24 y.o., female Today's Date: 03/01/2024  END OF SESSION:  PT End of Session - 03/01/24 1033     Visit Number 6    Date for Recertification  04/18/24    Authorization Type [employer]    PT Start Time 1030    PT Stop Time 1115    PT Time Calculation (min) 45 min    Activity Tolerance Patient tolerated treatment well    Behavior During Therapy WFL for tasks assessed/performed          Past Medical History:  Diagnosis Date   Anemia    Angio-edema    Eczema    Fibroids    STD (sexually transmitted disease)    chlamydia treated   Urticaria    Past Surgical History:  Procedure Laterality Date   BREAST REDUCTION SURGERY  03/2022   EYE SURGERY Bilateral 2006   Patient Active Problem List   Diagnosis Date Noted   Cholelithiasis during pregnancy 01/08/2024   Fibroid uterus 12/14/2023   Marginal insertion of umbilical cord affecting management of mother in second trimester 12/04/2023   Alpha thalassemia trait 10/23/2023   Vitamin D  deficiency 09/25/2023   Rubella non-immune status, antepartum 09/25/2023   Supervision of low-risk pregnancy 09/01/2023   Asthma 08/20/2023   Frequent headaches 07/03/2023   History of iron  deficiency anemia 12/12/2022   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 Having [employer] all day.   Initial subjective: due 04/17/24. Patient reports pelvic pain since she was pregnant and as the pernancy has increased. Tried a belly band and does not help. Try to do Kegels but not able to .  Fluid intake: water, juice  FUNCTIONAL LIMITATIONS: movements  PERTINENT HISTORY:  Medications for current condition: none Surgeries: see above Other: [place] Sexual abuse: No  PAIN:  Are you having pain? Yes NPRS scale: 2/10 02/09/24; Pain level 2/10 03/01/24: pain level 0/10 Pain location: pelvic area  Pain type: dull and  worse pain is deep ache; random pain that are sharp and shooting, spasms 2 x when using the bathroom Pain description: intermittent   Aggravating factors: excessive movement Relieving factors: wait the pain out; when getting out of a chair has to press on the sacrum  PRECAUTIONS: Other: pregnant  RED FLAGS: None   WEIGHT BEARING RESTRICTIONS: No  FALLS:  Has patient fallen in last 6 months? No  OCCUPATION: CMA  ACTIVITY LEVEL : low  PLOF: Independent  PATIENT GOALS: reduce pain   BOWEL MOVEMENT: No issues   URINATION: Pain with urination: No Fully empty bladder: Yes:                                           Post-void dribble: No Stream: Strong Urgency: Yes , due to pregnancy Frequency:during the day 3 hours                                                        Nocturia: No   Leakage: none Pads/briefs: No  INTERCOURSE:not having intercourse due to pregnancy.    PREGNANCY: Vaginal deliveries 0 C-section deliveries 0 Currently pregnant Yes: due 04/15/24  PROLAPSE: None   OBJECTIVE:  Note: Objective measures were completed at Evaluation unless otherwise noted.   PATIENT SURVEYS:  PFIQ-7: 24 POPIQ-7 24 03/01/24: PFIQ-7: 0 POPIQ-7 0  COGNITION: Overall cognitive status: Within functional limits for tasks assessed      FUNCTIONAL TESTS:  Single leg stance:  Rt:no hip  drop  Lt:no hip drop Squat:no difficulties Bed mobility:   LUMBARAROM/PROM:  A/PROM A/PROM  Eval (% available) 03/01/24  Flexion full full  Extension NT due to pregnancy full  Right lateral flexion 75 full  Left lateral flexion 100 full  Right rotation 75 full  Left rotation 100 full   (Blank rows = not tested)  LOWER EXTREMITY ROM: bilateral hip ROM is full   LOWER EXTREMITY MMT: bilateral hip strength is 4/5  PALPATION:  General: tenderness located on the left suprapubic area, tightness in the left hip adductors; decreased movement of the L4-S 1 area  Pelvic Alignment: Left ilium is posteriorly rotated, tenderness located on the left SI joint 02/09/24: ASIS are equal and left ilium is shallow                         TODAY'S TREATMENT: 03/01/24 Neuromuscular re-education: Pelvic floor contraction training: Pelvic  floor contractions for after birth Diaphragmatic breathing for after birth Transverse abdominus contraction for after birth Therapeutic activities: Functional strengthening activities: Educated patient on how to sit on the commode and breath to relax the pelvic floor for her first bowel movement after her giving birth.  Educated patient the different positions for moving the baby through the birth canal going through the different stages.  Self-care: Educated patient on perineal massage on a model, she returned demonstration correctly.  Educated patient on things to do after she has a baby for taking care of her perineal area, wearing a belly band for support, bracing her abdomen    02/23/24 Manual: Soft tissue mobilization: Laying on side and therapist working on the left intercostals to reduce the spasms.   Exercises: Stretches/mobility: Karolynn  pose hold 15 sec 5 times  Strengthening: Laying on side hip abduction 10 x 3 each side Quadruped lift opposite arm and leg with tactile cues to prevent increased lumbar lordosis 2 x 10 Standing with one leg  ahead of the other bilateral shoulder extension 10 x each way  Squats with holding onto the yellow tube 10 x  Standing off set and push the yellow band forward to opposite side  Self-care: Educated patient on how to stretch her lateral rib cage in sitting when she gets a cramp in the intercostals    02/16/24 Manual: Soft tissue mobilization: Laying on left side doing manual work to the lumbar and thoracic paraspinals and quadratus Myofascial release: Using the suction cup to the low thoracic and lumbar to reduce facial restrictions Exercises: Stretches/mobility: Karolynn pose hold 15 sec 5 times  1/2 kneel with posterior pelvic tilt and rotate to the leg that is forward 10 x each way Strengthening: Quadruped lift opposite arm and leg with tactile cues to prevent increased lumbar lordosis Laying on side hip abduction 10 x each side Standing bilateral shoulder extension with 10 # tube band Standing shoulder horizontal abduction with 10 # tube band Offset feet with reaching to foo forward with holding 3 # wt Therapeutic activities: Functional strengthening activities: Sit to supine and back with legs together to reduce strain on pubic bone Sitting in car and getting in and out with not separating the legs apart.     02/09/24 Manual: Soft tissue mobilization: Manual work to the left lumbar paraspinals, quadratus, and SI area while laying on side Kinesio tape Kinesio tape on the lower abdomen to support her abdomen to reduce strain on the lumbar and tailbone Neuromuscular re-education: Core retraining: Core facilitation: Quadruped lift opposite arm and leg 10 x each side Standing staggered reaching  Form correction: Laying on side with therapist gloved finger on the anococcygeal ligament with pelvic rock then therapist fingers on the coccygeus muscle with pelvic diagonal movements Down training: Diaphragmatic breathing to relax the pelvic floor Self-care: Educated patient on how  to use a tennis ball to massage the lumbar or pelvic floor to reduce tension Educated patient on how to place the kinesio tape on her abdomen, how long to stay on and to check her skin      PATIENT EDUCATION:  01/25/24 Education details: Access Code: [email or web address] Person educated: Patient Education method: Explanation, Demonstration, Tactile cues, Verbal cues, and Handouts Education comprehension: verbalized understanding, returned demonstration, verbal cues required, tactile cues required, and needs further education  HOME EXERCISE PROGRAM: 01/25/24 Access Code: [email or web address] URL: [email or web address] Date: 01/25/2024 Prepared by: Channing Pereyra  Program Notes sit on chair with yoga block between knees and move thigh forward and back 10 x 1 time per day.   Exercises - Quadruped Rocking Slow  - 1 x daily - 7 x weekly - 1 sets - 10 reps - Cat Cow  - 1 x daily - 7 x weekly - 1 sets - 10 reps  ASSESSMENT:  CLINICAL IMPRESSION: Patient is a 24 y.o. female who was seen today for physical therapy  treatment for pelvic pain affecting pregnancy. Patient is not having pain at this time. She is doing her exercises. She understands ways to progress the baby through the different stages of the pelvis. She understands how to take care of the perineal area after birth. She understands how to sit on the commode to have the first bowel movement after giving birth.   OBJECTIVE IMPAIRMENTS:  decreased activity tolerance, increased muscle spasms, and pain.   ACTIVITY LIMITATIONS: sitting, standing, squatting, and locomotion level  PARTICIPATION LIMITATIONS: community activity and occupation  PERSONAL FACTORS: Time since onset of injury/illness/exacerbation are also affecting patient's functional outcome.   REHAB POTENTIAL: Excellent  CLINICAL DECISION MAKING: Stable/uncomplicated  EVALUATION COMPLEXITY: Low   GOALS: Goals reviewed with patient? Yes  SHORT TERM GOALS: Target date:  02/22/24  Patient independent with initial HEP for flexibility and strength of SI and lumbar spine.  Baseline: Goal status: Met 02/16/24  2.  Patient is able to go from sit to stand and not hold onto the sacrum 2 out of 10 times due to increased in stability.  Baseline:  Goal status: Met 02/23/24   LONG TERM GOALS: Target date: 04/18/24  Patient is independent with advanced HEP for lumbar SI stability for work and home activities to reduce occurrence of pain .  Baseline:  Goal status: Met 03/01/24  2.  Patient is able to go from sit to stand without holding onto her sacrum 5 out of 10 times due to increased in stability.  Baseline:  Goal status: Met 03/01/24  3.  Patient  is able to work with her pain level </= 4/10 due to increased in strength and understanding how to manage her pain.  Baseline:  Goal status: Met 03/01/24  4.  Patient educated on ways to move the baby through the different stages of the pelvis to prepare for birth and reduce increase in back pain.  Baseline:  Goal status: Met 03/01/24   PLAN: Discharge to HEP this visit     Channing Pereyra, PT 03/01/24 11:23 AM   PHYSICAL THERAPY DISCHARGE SUMMARY  Visits from Start of Care: 6  Current functional level related to goals / functional outcomes: See above.    Remaining deficits: See above.    Education / Equipment: HEP   Patient agrees to discharge. Patient goals were met. Patient is being discharged due to meeting the stated rehab goals. Thank you for the referral.   Channing Pereyra, PT 03/01/24 11:23 AM   "

## 2024-03-01 NOTE — Patient Instructions (Signed)
 Vaginal Birth Recovery Information  What just happened? It is important to understand it took your baby 40 weeks to grow and your body 40 weeks to change for this as well. The recovery after pregnancy and delivery takes time. During your postpartum healing, it is important to focus on breathing mechanics to assist in returning your ribs to pre-pregnancy placement. Your pelvic floor is weaker than pre-pregnancy with supporting a baby during that time. Just like with any muscle after an injury, it is important learn how to strengthen and stabilize the pelvic floor to prevent dysfunction, bowel/bladder incontinence, or pain later. Your core or abdominal muscles also have been changing during pregnancy and need retraining to help support you.  At your first postpartum appointment, your doctor can refer you to a pelvic floor physical therapist to help with this retraining. What's next: First few weeks postpartum Trying to relax with your baby and limit moderate lifting for the first 2 weeks can help lessen your chances of prolapse and help you heal after delivery.  Using a log rolling technique to get in/out of bed by lying on your side first, then roll onto your back with hips and shoulders rolling together. To get out of bed, do this in reverse. Remember, don't hold your breath.  Prevent worsening of Diastasis Rectus Abdominis separation which is common after pregnancy or during postpartum. To prevent this from widening or worsening of this separation in your abdomen, log rolling, breathing out with lifting and changes of position, and bringing your bellow button to spine before activities can help! Help with DRA separation (Diastasis Rectus Abdominis) Wearing a postpartum support garment, not a waist trainer, can help give your support at your core after baby. Garments that cover your abdomen and have underwear attached at the bottom help prevent prolapse and provide support throughout your core and pelvis.  You wouldn't need to wear it if you were resting with your baby but with activity.  Improving diaphragmatic breathing, or big belly breathing, can help begin to rebuild your connection with your core and normal rib mobility.  I had tearing and or an Episiotomy, what do I do now? Having to have a cut made (Episiotomy) or if you naturally tore during delivery, can increase your risk of incontinence, prolapse, and/or pelvic pain. To help heal it is important to avoid constipation and straining to push out stool or urine. This will worsen the strain on your pelvic floor and your symptoms.  Drinking about half of your body weight in ounces in water and eating fiber can help as well as using a step stool or squatty potty for bowel movements and urination. Also, avoid sitting on the toilet longer than 10 minutes at a time and practice your deep belly breathing while sitting with your elbows on your knees and feet on the step stool to aid in relaxing your pelvic floor.   When should I tell the doctor? Change in postpartum bleeding, severe pain, or any change in mobility or status please contact your doctor. Pain with bowel movements, urination, at pelvis with mobility, intercourse, walking, sleeping, sitting, or breathing should be told to your doctor.  The feeling of anything falling out or like you're sitting on a bulge.  Burning, itching, scar sensitivity at vaginal opening or a noted dome or bulge in your abdomen with mobility.  Leaking even a small amount of urine should resolve within the first 6 weeks postpartum, if not inform your doctor.  Leaking stool or smearing  in your underwear. If you feel like something isn't right, trust your gut and inform the doctor to be further assessed as well.  Seeing a pelvic floor PT can also help you with scar mobility after tearing or an episiotomy, constipation, bowel/bladder incontinence postpartum, prolapse as well as pelvic pain, pain with intercourse postpartum,  and retraining your core.  Use a perianal bottle to squirt along the perineal area after using the commode and pat dry  How To Poop Better:  What are Good Poops? There is no one exact normal, but they should be REGULAR.  This varies from person to person and ranges from up to 3x/day or as little as 3-4/week.  This should stay consistent for you.  They should be formed and ideally one solid mass that doesn't fall apart or dissolve in the water and is brown in color.  Lifestyle Tips:  Fiber: Eat 25-31 grams per day Do not hold it.  If you need to go, GO! Try to go every day around the same time Walk and move more Probiotics for more healthy gut bacteria Water and fluids: half of your healthy body weight in ounces  Toileting Tips:  Posture: knees above hips, back flat, look straight ahead, RELAX Relax all the muscles from your face down to your toes Breathe: slow deep breaths into your belly and pelvic floor is RELAXED Blow: Tighten belly and blow like blowing up a balloon, make SH sound, make a vowel sound with a deep voice Do NOT sit more than 10 minutes After you are finished, tighten the muscles to reset pelvic floor back to normal      Can get a belly band to place around the abdomen for support Padsicles - Postpartum Pads for Mom PADSICLES How to Make Padsicles (postpartum frozen pads) to help soothe and heal after after delivering a baby. Padsicles are the best postpartum pads because they are covered in aloe vera, witch hazel and lavender oil.  Prep Time5 minutes Active Time15 minutes Total Time20 minutes Makes20 padsicles  EQUIPMENT teaspoon  SUPPLIES 20 Extra Heavy Overnight Maxi Pads Aloe Vera Designer, Television/film Set  INSTRUCTIONS Partially unwrap a few pads at a time, but don't detach the wrapper. Spread aloe vera generously up and down the whole pad. Don't just do the middle part - spread it further down almost to the bottom of the pad. Pour  about a teaspoon of witch hazel down the middle.  Add a few drops of lavender oil, for healing purposes.  Fold the pads back up to how they were and stick them in a gallon sized plastic bag, then freeze. Pull them out of the freezer, one by one, as needed and let them thaw for two or three minutes before use.  Peri anal bottle to use after going to the bathroom Use your toileting techniques and squatty potty Coconut oil for the nipples, or Earth mama nipple butter and nipple shield Compression underwear or workout pants  Can start to do pelvic floor contractions initially Belly breathing then contract the abdomen as you breath out.   Channing Pereyra, PT [facility] [address] [place], [place] [address] W: [phone] [email or web address]

## 2024-03-01 NOTE — Progress Notes (Signed)
 Vaginal irritation and thick, white discharge present following treatment with single Diflucan  and topical clotrimazole . Irritation and swelling of labia resolved following medications. Offered Monistat  to see if this would be more effective than Diflucan , but patient would prefer oral treatment. Single Diflucan  tablet sent to preferred pharmacy. If symptoms persist will need to reswab and consider vaginal cream.   Vernell RN 03/01/24

## 2024-03-02 ENCOUNTER — Ambulatory Visit: Admitting: Physical Therapy

## 2024-03-02 NOTE — Progress Notes (Signed)
 "  PRENATAL VISIT NOTE  Subjective:  Crystal Rhodes is a 24 y.o. G2P0010 at [redacted]w[redacted]d being seen today for ongoing prenatal care.  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  Patient reports a few episodes, mostly in the morning when she gets hot, weak and heavy feeling, sometimes feels palpitations and has to sit down. This tends to happen in the am either before or right after eating. Typically has coffee and either a breakfast sandwich or crackers for breakfast. Happened earlier in the week so has been wearing a CGM to look for hypoglycemia or sharp rises/falls in glucose.  Contractions: Not present. Vag. Bleeding: None.  Movement: Present. Denies leaking of fluid.   The following portions of the patient's history were reviewed and updated as appropriate: allergies, current medications, past family history, past medical history, past social history, past surgical history and problem list.   Objective:   Vitals:   02/24/24 1304  BP: 139/85  Pulse: (!) 110  Weight: 189 lb 6.4 oz (85.9 kg)   Fetal Status:  Fetal Heart Rate (bpm): 142 Fundal Height: 33 cm Movement: Present    General: Alert, oriented and cooperative. Patient is in no acute distress.  Skin: Skin is warm and dry. No rash noted.   Cardiovascular: Normal heart rate noted  Respiratory: Normal respiratory effort, no problems with respiration noted  Abdomen: Soft, gravid, appropriate for gestational age.  Pain/Pressure: Present     Pelvic: Cervical exam deferred        Extremities: Normal range  of motion.  Edema: None  Mental Status: Normal mood and affect. Normal behavior. Normal judgment and thought content.      09/30/2023   12:12 PM 12/25/2022    9:28 AM  Depression screen PHQ 2/9  Decreased Interest 0 0  Down, Depressed, Hopeless 0 0  PHQ - 2 Score 0 0  Altered sleeping 0   Tired, decreased energy 1   Change in appetite 0   Feeling bad or failure about yourself  0   Trouble concentrating 0   Moving slowly or fidgety/restless 0   Suicidal thoughts 0   PHQ-9 Score 1       Data saved with a previous flowsheet row definition        09/30/2023   12:15 PM  GAD 7 : Generalized Anxiety Score  Nervous, Anxious, on Edge 0   Control/stop worrying 1   Worry too much - different things 1   Trouble relaxing 0   Restless 0   Easily annoyed or irritable 3   Afraid - awful might happen 0   Total GAD 7 Score 5     Data saved with a previous flowsheet row definition   Assessment and Plan:  Pregnancy: G2P0010 at [redacted]w[redacted]d 1. Encounter for supervision of low-risk pregnancy in third trimester (Primary) - Doing well, feeling regular and vigorous fetal movement   2. [redacted] weeks gestation of pregnancy - Routine OB care   3. Postural dizziness with near syncope - Review of CGM does not show out of range or sharp spikes with reflexive hypoglycemia - Is anemic and taking Blood Builder - Coffee is  the one constant, advised to eat prior to drinking coffee and see if that helps. Will also refer to cardio - AMB Referral to Cardio Obstetrics  Preterm labor symptoms and general obstetric precautions including but not limited to vaginal bleeding, contractions, leaking of fluid and fetal movement were reviewed in detail with the patient. Please refer to After Visit Summary for other counseling recommendations.   Return in about 2 weeks (around 03/09/2024) for IN-PERSON, LOB.  Future Appointments  Date Time Provider Department Center  03/09/2024  1:15 PM Vannie Cornell JONELLE EDDY [facility]   03/10/2024  8:15 AM [facility] PROVIDER 1 [facility]  03/10/2024  8:30 AM [facility] US4 [facility]    Cornell JONELLE Vannie, CNM "

## 2024-03-03 ENCOUNTER — Encounter: Payer: Self-pay | Admitting: Obstetrics and Gynecology

## 2024-03-03 ENCOUNTER — Ambulatory Visit: Admitting: Obstetrics and Gynecology

## 2024-03-03 ENCOUNTER — Other Ambulatory Visit (HOSPITAL_COMMUNITY)
Admission: RE | Admit: 2024-03-03 | Discharge: 2024-03-03 | Disposition: A | Source: Ambulatory Visit | Attending: Obstetrics and Gynecology | Admitting: Obstetrics and Gynecology

## 2024-03-03 VITALS — BP 118/78 | HR 132 | Wt 191.0 lb

## 2024-03-03 DIAGNOSIS — O26899 Other specified pregnancy related conditions, unspecified trimester: Secondary | ICD-10-CM | POA: Diagnosis present

## 2024-03-03 DIAGNOSIS — O43192 Other malformation of placenta, second trimester: Secondary | ICD-10-CM

## 2024-03-03 DIAGNOSIS — N899 Noninflammatory disorder of vagina, unspecified: Secondary | ICD-10-CM | POA: Diagnosis present

## 2024-03-03 DIAGNOSIS — Z3A33 33 weeks gestation of pregnancy: Secondary | ICD-10-CM

## 2024-03-03 DIAGNOSIS — E559 Vitamin D deficiency, unspecified: Secondary | ICD-10-CM

## 2024-03-03 DIAGNOSIS — B009 Herpesviral infection, unspecified: Secondary | ICD-10-CM

## 2024-03-03 DIAGNOSIS — R002 Palpitations: Secondary | ICD-10-CM | POA: Diagnosis not present

## 2024-03-03 DIAGNOSIS — Z862 Personal history of diseases of the blood and blood-forming organs and certain disorders involving the immune mechanism: Secondary | ICD-10-CM | POA: Diagnosis not present

## 2024-03-03 DIAGNOSIS — R000 Tachycardia, unspecified: Secondary | ICD-10-CM | POA: Diagnosis not present

## 2024-03-03 DIAGNOSIS — O43193 Other malformation of placenta, third trimester: Secondary | ICD-10-CM | POA: Diagnosis not present

## 2024-03-03 DIAGNOSIS — N898 Other specified noninflammatory disorders of vagina: Secondary | ICD-10-CM | POA: Diagnosis not present

## 2024-03-03 DIAGNOSIS — Z3493 Encounter for supervision of normal pregnancy, unspecified, third trimester: Secondary | ICD-10-CM

## 2024-03-03 DIAGNOSIS — D259 Leiomyoma of uterus, unspecified: Secondary | ICD-10-CM | POA: Diagnosis not present

## 2024-03-03 DIAGNOSIS — O26893 Other specified pregnancy related conditions, third trimester: Secondary | ICD-10-CM

## 2024-03-03 DIAGNOSIS — Z2839 Other underimmunization status: Secondary | ICD-10-CM | POA: Diagnosis not present

## 2024-03-03 DIAGNOSIS — O479 False labor, unspecified: Secondary | ICD-10-CM | POA: Diagnosis not present

## 2024-03-03 NOTE — Addendum Note (Signed)
 Addended byBETHA WENCESLAO BROWNING on: 03/03/2024 01:17 PM   Modules accepted: Orders

## 2024-03-03 NOTE — Progress Notes (Signed)
 "  PRENATAL VISIT NOTE  Subjective:  Crystal Rhodes is a 24 y.o. G2P0010 at [redacted]w[redacted]d being seen today for ongoing prenatal care.  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  Patient reports contractions since the last few days, varying strength. Discomfort is more but not painful, contractions have been happening since Monday ever 10-20 min. Has had some vaginal fluid.   Contractions: Irritability. Vag. Bleeding: None.  Movement: Present. Denies leaking of fluid.   The following portions of the patient's history were reviewed and updated as appropriate: allergies, current medications, past family history, past medical history, past social history, past surgical history and problem list.   Objective:   Vitals:   03/03/24 1204  BP: 118/78  Pulse: (!) 132  Weight: 191 lb (86.6 kg)    Fetal Status:  Fetal Heart Rate (bpm): 147   Movement: Present    General: Alert, oriented and cooperative. Patient is in no acute distress.  Skin: Skin is warm and dry. No rash noted.   Cardiovascular: Normal heart rate noted  Respiratory: Normal respiratory effort, no problems with respiration noted  Abdomen: Soft, gravid, appropriate for gestational age.  Pain/Pressure: Present (lower pain/pelvic pressure)     Pelvic: Cervical exam performed in the presence of a chaperone Dilation: Closed Effacement (%): 20 Station: -4 (Ballotable) neg pooling, neg fern, thick white discharge  Extremities: Normal range of motion.  Edema: None  Mental Status: Normal mood and  affect. Normal behavior. Normal judgment and thought content.      09/30/2023   12:12 PM 12/25/2022    9:28 AM  Depression screen PHQ 2/9  Decreased Interest 0 0  Down, Depressed, Hopeless 0 0  PHQ - 2 Score 0 0  Altered sleeping 0   Tired, decreased energy 1   Change in appetite 0   Feeling bad or failure about yourself  0   Trouble concentrating 0   Moving slowly or fidgety/restless 0   Suicidal thoughts 0   PHQ-9 Score 1       Data saved with a previous flowsheet row definition        09/30/2023   12:15 PM  GAD 7 : Generalized Anxiety Score  Nervous, Anxious, on Edge 0   Control/stop worrying 1   Worry too much - different things 1   Trouble relaxing 0   Restless 0   Easily annoyed or irritable 3   Afraid - awful might happen 0   Total GAD 7 Score 5     Data saved with a previous flowsheet row definition    Assessment and Plan:  Pregnancy: G2P0010 at [redacted]w[redacted]d  1. Uterine leiomyoma, unspecified location (Primary) 4 cm anterior right  2. Encounter for supervision of low-risk pregnancy in third trimester  3. Marginal insertion of umbilical cord affecting management of mother in second trimester Last growth 59%tile  4. Rubella non-immune status, antepartum MMR pp  5. Vitamin D  deficiency  6. History of iron  deficiency anemia  7. 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 Irregular contractions No cervical dilation Suspect related to yeast infection that she was treated  for recently Will check BV  9. Tachycardia Feels yucky in the morning where she has to stop and bend over but no fainting episodes Reports CGM data was okay Has been referred to cards  10. Palpitations    Preterm labor symptoms and general obstetric precautions including but not limited to vaginal bleeding, contractions, leaking of fluid and fetal movement were reviewed in detail with the patient. Please refer to After Visit Summary for other counseling  recommendations.   Return in about 6 days (around 03/09/2024) for as scheduled.  Future Appointments  Date Time Provider Department Center  03/09/2024  1:15 PM Crystal Rhodes [facility]  03/10/2024  8:15 AM [facility] PROVIDER 1 [facility]  03/10/2024  8:30 AM [facility] US4 [facility]    Crystal CHRISTELLA Moats, MD  "

## 2024-03-04 ENCOUNTER — Ambulatory Visit: Admitting: Physical Therapy

## 2024-03-04 ENCOUNTER — Ambulatory Visit: Payer: Self-pay | Admitting: Obstetrics and Gynecology

## 2024-03-04 LAB — CERVICOVAGINAL ANCILLARY ONLY
Bacterial Vaginitis (gardnerella): NEGATIVE
Candida Glabrata: NEGATIVE
Candida Vaginitis: NEGATIVE
Comment: NEGATIVE
Comment: NEGATIVE
Comment: NEGATIVE

## 2024-03-07 LAB — GLUCOSE, CAPILLARY: Glucose-Capillary: 76 mg/dL (ref 70–99)

## 2024-03-09 ENCOUNTER — Ambulatory Visit: Payer: Self-pay | Admitting: Certified Nurse Midwife

## 2024-03-09 VITALS — BP 126/87 | HR 110 | Wt 194.5 lb

## 2024-03-09 DIAGNOSIS — Z3A34 34 weeks gestation of pregnancy: Secondary | ICD-10-CM | POA: Diagnosis not present

## 2024-03-09 DIAGNOSIS — G479 Sleep disorder, unspecified: Secondary | ICD-10-CM

## 2024-03-09 DIAGNOSIS — Z3493 Encounter for supervision of normal pregnancy, unspecified, third trimester: Secondary | ICD-10-CM | POA: Diagnosis not present

## 2024-03-09 DIAGNOSIS — B009 Herpesviral infection, unspecified: Secondary | ICD-10-CM

## 2024-03-10 ENCOUNTER — Ambulatory Visit (HOSPITAL_BASED_OUTPATIENT_CLINIC_OR_DEPARTMENT_OTHER)

## 2024-03-10 ENCOUNTER — Ambulatory Visit: Attending: Obstetrics and Gynecology | Admitting: Obstetrics and Gynecology

## 2024-03-10 ENCOUNTER — Encounter: Admitting: Physical Therapy

## 2024-03-10 ENCOUNTER — Other Ambulatory Visit: Payer: Self-pay

## 2024-03-10 VITALS — BP 129/73 | HR 108

## 2024-03-10 DIAGNOSIS — O43193 Other malformation of placenta, third trimester: Secondary | ICD-10-CM | POA: Insufficient documentation

## 2024-03-10 DIAGNOSIS — Z0289 Encounter for other administrative examinations: Secondary | ICD-10-CM

## 2024-03-10 DIAGNOSIS — Z3A28 28 weeks gestation of pregnancy: Secondary | ICD-10-CM | POA: Diagnosis not present

## 2024-03-10 DIAGNOSIS — K8070 Calculus of gallbladder and bile duct without cholecystitis without obstruction: Secondary | ICD-10-CM

## 2024-03-10 DIAGNOSIS — D259 Leiomyoma of uterus, unspecified: Secondary | ICD-10-CM | POA: Diagnosis not present

## 2024-03-10 DIAGNOSIS — O43192 Other malformation of placenta, second trimester: Secondary | ICD-10-CM

## 2024-03-10 DIAGNOSIS — Z3A34 34 weeks gestation of pregnancy: Secondary | ICD-10-CM | POA: Diagnosis not present

## 2024-03-10 DIAGNOSIS — O26619 Liver and biliary tract disorders in pregnancy, unspecified trimester: Secondary | ICD-10-CM

## 2024-03-10 DIAGNOSIS — Z2911 Encounter for prophylactic immunotherapy for respiratory syncytial virus (RSV): Secondary | ICD-10-CM

## 2024-03-10 DIAGNOSIS — O3413 Maternal care for benign tumor of corpus uteri, third trimester: Secondary | ICD-10-CM

## 2024-03-10 DIAGNOSIS — Z3493 Encounter for supervision of normal pregnancy, unspecified, third trimester: Secondary | ICD-10-CM

## 2024-03-10 DIAGNOSIS — Z148 Genetic carrier of other disease: Secondary | ICD-10-CM | POA: Insufficient documentation

## 2024-03-10 MED ORDER — MAGNESIUM CITRATE 200 MG PO TABS: 2.0000 | ORAL_TABLET | Freq: Every evening | ORAL | 2 refills | Status: AC

## 2024-03-10 MED ORDER — VALACYCLOVIR HCL 500 MG PO TABS: 500.0000 mg | ORAL_TABLET | Freq: Two times a day (BID) | ORAL | 1 refills | Status: AC

## 2024-03-10 NOTE — Progress Notes (Signed)
 After review, MFM consult with provider is not indicated for today  Arna Ranks, MD 03/10/2024 9:18 AM  [facility]

## 2024-03-11 ENCOUNTER — Ambulatory Visit: Admitting: Physical Therapy

## 2024-03-15 ENCOUNTER — Encounter: Admitting: Physical Therapy

## 2024-03-15 NOTE — Progress Notes (Signed)
 "  PRENATAL VISIT NOTE  Subjective:  Crystal Rhodes is a 24 y.o. G2P0010 at [redacted]w[redacted]d being seen today for ongoing prenatal care.  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  Patient reports insomnia, overall feeling tired and ready to be done with pregnancy.  Contractions: Irritability. Vag. Bleeding: None.  Movement: Present. Denies leaking of fluid.   The following portions of the patient's history were reviewed and updated as appropriate: allergies, current medications, past family history, past medical history, past social history, past surgical history and problem list.   Objective:   Vitals:   03/09/24 1324  BP: 126/87  Pulse: (!) 110  Weight: 194 lb 8 oz (88.2 kg)    Fetal Status:  Fetal Heart Rate (bpm): 160 Fundal Height: 35 cm Movement: Present    General: Alert, oriented and cooperative. Patient is in no acute distress.  Skin: Skin is warm and dry. No rash noted.   Cardiovascular: Normal heart rate noted  Respiratory: Normal respiratory effort, no problems with respiration noted  Abdomen: Soft, gravid, appropriate for gestational age.  Pain/Pressure: Present     Pelvic: Cervical exam deferred        Extremities: Normal range of motion.  Edema: None  Mental Status: Normal mood and affect. Normal behavior. Normal judgment and thought content.      09/30/2023   12:12 PM 12/25/2022    9:28 AM  Depression screen PHQ 2/9  Decreased Interest 0 0  Down, Depressed, Hopeless 0 0  PHQ - 2 Score 0 0  Altered sleeping 0   Tired,  decreased energy 1   Change in appetite 0   Feeling bad or failure about yourself  0   Trouble concentrating 0   Moving slowly or fidgety/restless 0   Suicidal thoughts 0   PHQ-9 Score 1       Data saved with a previous flowsheet row definition        09/30/2023   12:15 PM  GAD 7 : Generalized Anxiety Score  Nervous, Anxious, on Edge 0   Control/stop worrying 1   Worry too much - different things 1   Trouble relaxing 0   Restless 0   Easily annoyed or irritable 3   Afraid - awful might happen 0   Total GAD 7 Score 5     Data saved with a previous flowsheet row definition    Assessment and Plan:  Pregnancy: G2P0010 at [redacted]w[redacted]d 1. Encounter for supervision of low-risk pregnancy in third trimester (Primary) - Feeling regular and vigorous fetal movement   2. [redacted] weeks gestation of pregnancy - Routine OB care   3. 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  Dispense: 60 tablet; Refill: 1  4. Sleep difficulties - Magnesium  Citrate 200 MG TABS; Take 400 mg by mouth at bedtime. May decrease to 1 tablet if loose bowel movements.  Dispense: 60 tablet; Refill: 2  Preterm labor symptoms and general obstetric precautions including but not limited to vaginal bleeding, contractions, leaking of fluid and fetal movement were  reviewed in detail with the patient. Please refer to After Visit Summary for other counseling recommendations.   Return in about 2 weeks (around 03/23/2024) for IN-PERSON, LOB/GBS.  Future Appointments  Date Time Provider Department Center  03/23/2024  8:15 AM Vannie Cornell SAUNDERS, CNM [facility]    Cornell SAUNDERS Vannie, CNM "

## 2024-03-16 ENCOUNTER — Telehealth: Payer: Self-pay | Admitting: Cardiology

## 2024-03-16 NOTE — Telephone Encounter (Signed)
 Left vm to sched pt w/Dr. Sheena, per referral from J.Walker. Per JN, slots available on 2/5 & 2/12.  Please send me a secure chat or staff msg if/when pt returns phone call.  Thanks, JB

## 2024-03-17 ENCOUNTER — Ambulatory Visit: Admitting: Cardiology

## 2024-03-17 ENCOUNTER — Ambulatory Visit

## 2024-03-17 VITALS — BP 132/84 | HR 110 | Ht 62.0 in | Wt 195.8 lb

## 2024-03-17 DIAGNOSIS — R42 Dizziness and giddiness: Secondary | ICD-10-CM

## 2024-03-17 DIAGNOSIS — R002 Palpitations: Secondary | ICD-10-CM

## 2024-03-17 NOTE — Telephone Encounter (Signed)
 Pt seen in office today

## 2024-03-17 NOTE — Progress Notes (Unsigned)
 Enrolled patient for a 14 day Zio XT  monitor to be mailed to patients home

## 2024-03-17 NOTE — Patient Instructions (Signed)
 Medication Instructions:  Your physician recommends that you continue on your current medications as directed. Please refer to the Current Medication list given to you today.  *If you need a refill on your cardiac medications before your next appointment, please call your pharmacy*  Please take your blood pressure daily for 2 weeks and send in a MyChart message. Please include heart rates. (One message at the end of the 2 weeks).   HOW TO TAKE YOUR BLOOD PRESSURE: Rest 5 minutes before taking your blood pressure. Dont smoke or drink caffeinated beverages for at least 30 minutes before. Take your blood pressure before (not after) you eat. Sit comfortably with your back supported and both feet on the floor (dont cross your legs). Elevate your arm to heart level on a table or a desk. Use the proper sized cuff. It should fit smoothly and snugly around your bare upper arm. There should be enough room to slip a fingertip under the cuff. The bottom edge of the cuff should be 1 inch above the crease of the elbow. Ideally, take 3 measurements at one sitting and record the average.  Blood Pressure Log Date/Time Medications taken? (Y/N) Blood Pressure Heart Rate/Pulse                                                                                                                                                                                          Lab Work: CMET, Mag If you have labs (blood work) drawn today and your tests are completely normal, you will receive your results only by: MyChart Message (if you have MyChart) OR A paper copy in the mail If you have any lab test that is abnormal or we need to change your treatment, we will call you to review the results.  Testing/Procedures: Your physician has requested that you have an echocardiogram. Echocardiography is a painless test that uses sound waves to create images of your heart. It provides your doctor with information  about the size and shape of your heart and how well your hearts chambers and valves are working. This procedure takes approximately one hour. There are no restrictions for this procedure. Please do NOT wear cologne, perfume, aftershave, or lotions (deodorant is allowed). Please arrive 15 minutes prior to your appointment time.  Please note: We ask at that you not bring children with you during ultrasound (echo/ vascular) testing. Due to room size and safety concerns, children are not allowed in the ultrasound rooms during exams. Our front office staff cannot provide observation of children in our lobby area while testing is being conducted. An adult accompanying a patient to their appointment will only  be allowed in the ultrasound room at the discretion of the ultrasound technician under special circumstances. We apologize for any inconvenience.  ZIO XT- Long Term Monitor Instructions  Your physician has requested you wear a ZIO patch monitor for 14 days.  This is a single patch monitor. [employer] supplies one patch monitor per enrollment. Additional stickers are not available. Please do not apply patch if you will be having a Nuclear Stress Test,  Echocardiogram, Cardiac CT, MRI, or Chest Xray during the period you would be wearing the  monitor. The patch cannot be worn during these tests. You cannot remove and re-apply the  ZIO XT patch monitor.  Your ZIO patch monitor will be mailed 3 day USPS to your address on file. It may take 3-5 days  to receive your monitor after you have been enrolled.  Once you have received your monitor, please review the enclosed instructions. Your monitor  has already been registered assigning a specific monitor serial # to you.  Billing and Patient Assistance Program Information  We have supplied [employer] with any of your insurance information on file for billing purposes. [employer] offers a sliding scale Patient Assistance Program for patients that do not have   insurance, or whose insurance does not completely cover the cost of the ZIO monitor.  You must apply for the Patient Assistance Program to qualify for this discounted rate.  To apply, please call [employer] at [phone], select option 4, select option 2, ask to apply for  Patient Assistance Program. Mered[employer] will ask your household income, and how many people  are in your household. They will quote your out-of-pocket cost based on that information.  [employer] will also be able to set up a 32-month, interest-free payment plan if needed.  Applying the monitor   Shave hair from upper left chest.  Hold abrader disc by orange tab. Rub abrader in 40 strokes over the upper left chest as  indicated in your monitor instructions.  Clean area with 4 enclosed alcohol pads. Let dry.  Apply patch as indicated in monitor instructions. Patch will be placed under collarbone on left  side of chest with arrow pointing upward.  Rub patch adhesive wings for 2 minutes. Remove white label marked 1. Remove the white  label marked 2. Rub patch adhesive wings for 2 additional minutes.  While looking in a mirror, press and release button in center of patch. A small green light will  flash 3-4 times. This will be your only indicator that the monitor has been turned on.  Do not shower for the first 24 hours. You may shower after the first 24 hours.  Press the button if you feel a symptom. You will hear a small click. Record Date, Time and  Symptom in the Patient Logbook.  When you are ready to remove the patch, follow instructions on the last 2 pages of Patient  Logbook. Stick patch monitor onto the last page of Patient Logbook.  Place Patient Logbook in the blue and white box. Use locking tab on box and tape box closed  securely. The blue and white box has prepaid postage on it. Please place it in the mailbox as  soon as possible. Your physician should have your test results approximately 7 days after the  monitor  has been mailed back to [facility].  Call [facility] Customer Care at [phone] if you have questions regarding  your ZIO XT patch monitor. Call them immediately if you see an orange light blinking on  your  monitor.  If your monitor falls off in less than 4 days, contact our Monitor department at [phone].  If your monitor becomes loose or falls off after 4 days call [employer] at [phone] for  suggestions on securing your monitor   Follow-Up: At [facility], you and your health needs are our priority.  As part of our continuing mission to provide you with exceptional heart care, our providers are all part of one team.  This team includes your primary Cardiologist (physician) and Advanced Practice Providers or APPs (Physician Assistants and Nurse Practitioners) who all work together to provide you with the care you need, when you need it.  Your next appointment:    As needed  Provider:   Kardie Tobb, DO     Other Instructions:

## 2024-03-17 NOTE — Progress Notes (Unsigned)
 "  [facility] Clinic  New Evaluation  Date:  03/17/2024   ID:  Crystal Rhodes, DOB 08/10/2000, MRN [id number]  PCP:  Gerome Brunet, DO   [facility] Providers Cardiologist:  None  Electrophysiologist:  None   { Click to update primary MD,subspecialty MD or APP then REFRESH:1}    Referring MD: Crystal Rhodes, CNM   Chief Complaint:  I am   History of Present Illness:    Crystal Rhodes is a 24 y.o. female [G2P0010] who is being seen today for the evaluation of presyncope at the request of Crystal Rhodes, CNM.   She is [redacted] weeks pregnant. She reports increasing episodes of dizziness and palpitations. During episodes she feels woozy, sees spots, senses forceful heartbeats, and feels hot. Symptoms occur mainly while standing at work. She has had no syncope.  During episodes her blood pressure is about 140/90. Her heart rate is persistently elevated in the 120s to 130s, with a peak around 132 bpm.  She has borderline elevated blood pressure and is not taking antihypertensives. She notes swelling of her ankles and fingers, usually by late afternoon and sometimes even when not on her feet, including random swelling of fingers and toes at work.  Prior CV Studies Reviewed: The following studies were reviewed today:   Past Medical History:  Diagnosis Date   Anemia    Angio-edema    Eczema    Fibroids    STD (sexually transmitted disease)    chlamydia treated   Urticaria     Past Surgical History:  Procedure Laterality Date   BREAST REDUCTION SURGERY  03/2022   EYE SURGERY Bilateral 2006   { Click here to update PMH, PSH, OB Hx then refresh note  :1}   OB History     Gravida  2   Para  0   Term  0   Preterm  0   AB  1   Living  0      SAB  0   IAB  1   Ectopic  0   Multiple  0   Live Births  0           { Click here to update OB Charting then refresh note  :1}    Current Medications: Active Medications[1]   Allergies:   Iron   and Venofer  [iron  sucrose]   Social History   Socioeconomic History   Marital status: Single    Spouse name: Not on file   Number of children: 0   Years of education: Not on file   Highest education level: Associate degree: academic program  Occupational History   Not on file  Tobacco Use   Smoking status: Never    Passive exposure: Past   Smokeless tobacco: Never  Vaping Use   Vaping status: Never Used  Substance and Sexual Activity   Alcohol use: Not Currently    Comment: occ   Drug use: No   Sexual activity: Not Currently    Partners: Male    Birth control/protection: None  Other Topics Concern   Not on file  Social History Narrative   Not on file   Social Drivers of Health   Tobacco Use: Low Risk (03/03/2024)   Patient History    Smoking Tobacco Use: Never    Smokeless Tobacco Use: Never    Passive Exposure: Past  Financial Resource Strain: Not on file  Food Insecurity: No Food Insecurity (09/30/2023)   Epic    Worried About  Running Out of Food in the Last Year: Never true    Ran Out of Food in the Last Year: Never true  Transportation Needs: No Transportation Needs (09/30/2023)   Epic    Lack of Transportation (Medical): No    Lack of Transportation (Non-Medical): No  Physical Activity: Not on file  Stress: Not on file  Social Connections: Not on file  Depression (PHQ2-9): Low Risk (09/30/2023)   Depression (PHQ2-9)    PHQ-2 Score: 1  Alcohol Screen: Not on file  Housing: Not on file  Utilities: Not on file  Health Literacy: Not on file  { Click here to update SDOH then refresh :1}    Family History  Problem Relation Age of Onset   Hypertension Mother    Hypothyroidism Mother    Diabetes type II Father    Asthma Sister    Asthma Sister    Cancer Paternal Aunt    Heart Problems Paternal Aunt    { Click here to update FH then refresh note    :1}   ROS:   Please see the history of present illness.    *** All other systems reviewed and are  negative.   Labs/EKG Reviewed:    EKG:   EKG is *** ordered today.  The ekg ordered today demonstrates ***  Recent Labs: 01/07/2024: ALT 14; BUN 6; Creatinine, Ser 0.65; Potassium 3.5; Sodium 137 01/13/2024: Platelets 223 02/15/2024: Hemoglobin 9.8   Recent Lipid Panel No results found for: CHOL, TRIG, HDL, CHOLHDL, LDLCALC, LDLDIRECT  Physical Exam:    VS:  BP (!) 140/90 (BP Location: Left Arm, Patient Position: Sitting, Cuff Size: Normal)   Pulse (!) 110   Ht 5' 2 (1.575 m)   Wt 195 lb 12.8 oz (88.8 kg)   LMP 07/12/2023 (Exact Date)   SpO2 98%   BMI 35.81 kg/m     Wt Readings from Last 3 Encounters:  03/17/24 195 lb 12.8 oz (88.8 kg)  03/09/24 194 lb 8 oz (88.2 kg)  03/03/24 191 lb (86.6 kg)     GEN:  Well nourished, well developed in no acute distress HEENT: Normal NECK: No JVD; No carotid bruits LYMPHATICS: No lymphadenopathy CARDIAC: RRR, no murmurs, rubs, gallops RESPIRATORY:  Clear to auscultation without rales, wheezing or rhonchi  ABDOMEN: Soft, non-tender, non-distended MUSCULOSKELETAL:  No edema; No deformity  SKIN: Warm and dry NEUROLOGIC:  Alert and oriented x 3 PSYCHIATRIC:  Normal affect    Risk Assessment/Risk Calculators:   { Click to calculate CARPREG II - THEN refresh note :1}    { Click to caclulate Mod WHO Class of CV Risk - THEN refresh note :1}     { Click for CHADS2VASc Score - THEN Refresh Note    :[id number]}      ASSESSMENT & PLAN:    Palpitations and dizziness complicating pregnancy EKG shows sinus tachycardia, expected in pregnancy.  Normal pregnancy physiology  symptoms may be exacerbated by dehydration and electrolyte imbalances. But with the presyncope is best to rule out an arrhythmia. - Placed a heart monitor for two weeks to assess for arrhythmias. - Ordered an echocardiogram to evaluate cardiac function given the presyncope. - Encouraged hydration and will check magnesium  levels. - Advised on the use of  support stockings if needed.  Iron  deficiency anemia in pregnancy Iron  deficiency anemia with recent hemoglobin drop to 9.0. Currently on iron  supplementation. Recent iron  panel showed adequate levels, but anemia persists. - Continue iron  supplementation.  Gestational hypertension, borderline Blood  pressure readings are borderline, with occasional elevations. - Monitor blood pressure regularly.  She has not updated blood pressure in 2 weeks - Encouraged adequate hydration.    There are no Patient Instructions on file for this visit.   Dispo:  No follow-ups on file.   Medication Adjustments/Labs and Tests Ordered: Current medicines are reviewed at length with the patient today.  Concerns regarding medicines are outlined above.  Tests Ordered: Orders Placed This Encounter  Procedures   EKG 12-Lead   Medication Changes: No orders of the defined types were placed in this encounter.     [1]  Current Meds  Medication Sig   acetaminophen  (TYLENOL ) 500 MG tablet Take 1,000 mg by mouth every 6 (six) hours as needed.   aspirin EC 81 MG tablet Take 81 mg by mouth daily. Swallow whole.   cyclobenzaprine  (FLEXERIL ) 10 MG tablet Take 1 tablet (10 mg total) by mouth 2 (two) times daily as needed for muscle spasms.   IRON , FERROUS SULFATE, PO Take 1 tablet by mouth daily. Blood Builder   omeprazole  (PRILOSEC) 40 MG capsule Take 1 capsule (40 mg total) by mouth daily.   Prenat w/o A-FE-Methf-FA-Omega (ZATEAN-PN PLUS) 28-0.6-0.4-340 MG CAPS TAKE 1 CAPSULE BY MOUTH DAILY AFTER BREAKFAST.   valACYclovir  (VALTREX ) 500 MG tablet Take 1 tablet (500 mg total) by mouth 2 (two) times daily.   "

## 2024-03-18 ENCOUNTER — Ambulatory Visit: Payer: Self-pay | Admitting: Cardiology

## 2024-03-18 ENCOUNTER — Ambulatory Visit (HOSPITAL_COMMUNITY): Admission: RE | Admit: 2024-03-18 | Attending: Cardiology

## 2024-03-18 ENCOUNTER — Ambulatory Visit: Admitting: Physical Therapy

## 2024-03-18 DIAGNOSIS — R55 Syncope and collapse: Secondary | ICD-10-CM | POA: Diagnosis not present

## 2024-03-18 DIAGNOSIS — R42 Dizziness and giddiness: Secondary | ICD-10-CM

## 2024-03-18 LAB — ECHOCARDIOGRAM COMPLETE
Area-P 1/2: 3.68 cm2
S' Lateral: 3.3 cm

## 2024-03-22 ENCOUNTER — Encounter: Admitting: Physical Therapy

## 2024-03-23 ENCOUNTER — Encounter: Payer: Self-pay | Admitting: Certified Nurse Midwife

## 2024-03-25 ENCOUNTER — Ambulatory Visit: Admitting: Physical Therapy

## 2024-04-01 ENCOUNTER — Ambulatory Visit: Admitting: Physical Therapy

## 2024-04-08 ENCOUNTER — Ambulatory Visit: Admitting: Physical Therapy

## 2024-04-12 ENCOUNTER — Encounter: Admitting: Physical Therapy

## 2024-04-15 ENCOUNTER — Ambulatory Visit: Admitting: Physical Therapy

## 2024-04-17 ENCOUNTER — Inpatient Hospital Stay (HOSPITAL_COMMUNITY): Admit: 2024-04-17
